# Patient Record
Sex: Female | Born: 1955 | Race: White | Hispanic: No | Marital: Married | Smoking: Never smoker
Health system: Southern US, Community
[De-identification: ages and names within clinical notes are randomized; demographics above are authoritative.]

## PROBLEM LIST (undated history)

## (undated) DIAGNOSIS — T8859XA Other complications of anesthesia, initial encounter: Secondary | ICD-10-CM

## (undated) DIAGNOSIS — G259 Extrapyramidal and movement disorder, unspecified: Secondary | ICD-10-CM

## (undated) DIAGNOSIS — N390 Urinary tract infection, site not specified: Secondary | ICD-10-CM

## (undated) DIAGNOSIS — G2581 Restless legs syndrome: Secondary | ICD-10-CM

## (undated) DIAGNOSIS — N8110 Cystocele, unspecified: Secondary | ICD-10-CM

## (undated) DIAGNOSIS — I10 Essential (primary) hypertension: Secondary | ICD-10-CM

## (undated) DIAGNOSIS — I890 Lymphedema, not elsewhere classified: Secondary | ICD-10-CM

## (undated) DIAGNOSIS — C50919 Malignant neoplasm of unspecified site of unspecified female breast: Secondary | ICD-10-CM

## (undated) DIAGNOSIS — N814 Uterovaginal prolapse, unspecified: Secondary | ICD-10-CM

## (undated) DIAGNOSIS — M170 Bilateral primary osteoarthritis of knee: Secondary | ICD-10-CM

## (undated) DIAGNOSIS — T4145XA Adverse effect of unspecified anesthetic, initial encounter: Secondary | ICD-10-CM

## (undated) DIAGNOSIS — J3489 Other specified disorders of nose and nasal sinuses: Secondary | ICD-10-CM

## (undated) DIAGNOSIS — G20A1 Parkinson's disease without dyskinesia, without mention of fluctuations: Secondary | ICD-10-CM

## (undated) DIAGNOSIS — I129 Hypertensive chronic kidney disease with stage 1 through stage 4 chronic kidney disease, or unspecified chronic kidney disease: Secondary | ICD-10-CM

## (undated) DIAGNOSIS — R42 Dizziness and giddiness: Secondary | ICD-10-CM

## (undated) DIAGNOSIS — G4730 Sleep apnea, unspecified: Secondary | ICD-10-CM

## (undated) DIAGNOSIS — I82409 Acute embolism and thrombosis of unspecified deep veins of unspecified lower extremity: Secondary | ICD-10-CM

## (undated) DIAGNOSIS — Z9011 Acquired absence of right breast and nipple: Secondary | ICD-10-CM

## (undated) DIAGNOSIS — H8309 Labyrinthitis, unspecified ear: Secondary | ICD-10-CM

## (undated) DIAGNOSIS — R519 Headache, unspecified: Secondary | ICD-10-CM

## (undated) DIAGNOSIS — K117 Disturbances of salivary secretion: Secondary | ICD-10-CM

## (undated) DIAGNOSIS — R300 Dysuria: Secondary | ICD-10-CM

## (undated) DIAGNOSIS — F419 Anxiety disorder, unspecified: Secondary | ICD-10-CM

## (undated) DIAGNOSIS — J189 Pneumonia, unspecified organism: Secondary | ICD-10-CM

## (undated) DIAGNOSIS — T7840XA Allergy, unspecified, initial encounter: Secondary | ICD-10-CM

## (undated) DIAGNOSIS — E785 Hyperlipidemia, unspecified: Secondary | ICD-10-CM

## (undated) DIAGNOSIS — K900 Celiac disease: Secondary | ICD-10-CM

## (undated) DIAGNOSIS — L989 Disorder of the skin and subcutaneous tissue, unspecified: Secondary | ICD-10-CM

## (undated) DIAGNOSIS — M5116 Intervertebral disc disorders with radiculopathy, lumbar region: Secondary | ICD-10-CM

## (undated) DIAGNOSIS — G43909 Migraine, unspecified, not intractable, without status migrainosus: Secondary | ICD-10-CM

## (undated) DIAGNOSIS — G629 Polyneuropathy, unspecified: Secondary | ICD-10-CM

## (undated) HISTORY — DX: Acquired absence of right breast and nipple: Z90.11

## (undated) HISTORY — DX: Lymphedema, not elsewhere classified: I89.0

## (undated) HISTORY — DX: Acute embolism and thrombosis of unspecified deep veins of unspecified lower extremity: I82.409

## (undated) HISTORY — DX: Parkinson's disease without dyskinesia, without mention of fluctuations: G20.A1

## (undated) HISTORY — DX: Celiac disease: K90.0

## (undated) HISTORY — DX: Urinary tract infection, site not specified: N39.0

## (undated) HISTORY — DX: Headache, unspecified: R51.9

## (undated) HISTORY — DX: Disturbances of salivary secretion: K11.7

## (undated) HISTORY — PX: COLONOSCOPY: SHX174

## (undated) HISTORY — DX: Hyperlipidemia, unspecified: E78.5

## (undated) HISTORY — DX: Bilateral primary osteoarthritis of knee: M17.0

## (undated) HISTORY — DX: Sleep apnea, unspecified: G47.30

## (undated) HISTORY — DX: Labyrinthitis, unspecified ear: H83.09

## (undated) HISTORY — DX: Cystocele, unspecified: N81.10

## (undated) HISTORY — DX: Disorder of the skin and subcutaneous tissue, unspecified: L98.9

## (undated) HISTORY — DX: Restless legs syndrome: G25.81

## (undated) HISTORY — PX: FUNCTIONAL ENDOSCOPIC SINUS SURGERY: SUR616

## (undated) HISTORY — DX: Uterovaginal prolapse, unspecified: N81.4

## (undated) HISTORY — DX: Allergy, unspecified, initial encounter: T78.40XA

## (undated) HISTORY — DX: Pneumonia, unspecified organism: J18.9

## (undated) HISTORY — DX: Dysuria: R30.0

## (undated) HISTORY — DX: Other specified disorders of nose and nasal sinuses: J34.89

## (undated) HISTORY — DX: Polyneuropathy, unspecified: G62.9

## (undated) HISTORY — DX: Hypertensive chronic kidney disease with stage 1 through stage 4 chronic kidney disease, or unspecified chronic kidney disease: I12.9

## (undated) HISTORY — DX: Extrapyramidal and movement disorder, unspecified: G25.9

## (undated) HISTORY — DX: Dizziness and giddiness: R42

## (undated) HISTORY — DX: Intervertebral disc disorders with radiculopathy, lumbar region: M51.16

## (undated) HISTORY — DX: Malignant neoplasm of unspecified site of unspecified female breast: C50.919

---

## 1998-06-10 DIAGNOSIS — I82409 Acute embolism and thrombosis of unspecified deep veins of unspecified lower extremity: Secondary | ICD-10-CM

## 1998-06-10 HISTORY — DX: Acute embolism and thrombosis of unspecified deep veins of unspecified lower extremity: I82.409

## 2002-06-10 HISTORY — PX: VAGINAL HYSTERECTOMY: SUR661

## 2011-05-07 HISTORY — PX: BREAST BIOPSY: SHX20

## 2011-05-11 DIAGNOSIS — C50919 Malignant neoplasm of unspecified site of unspecified female breast: Secondary | ICD-10-CM

## 2011-05-11 HISTORY — DX: Malignant neoplasm of unspecified site of unspecified female breast: C50.919

## 2011-05-23 ENCOUNTER — Telehealth: Payer: Self-pay | Admitting: *Deleted

## 2011-05-23 ENCOUNTER — Other Ambulatory Visit: Payer: Self-pay | Admitting: *Deleted

## 2011-05-23 DIAGNOSIS — C50419 Malignant neoplasm of upper-outer quadrant of unspecified female breast: Secondary | ICD-10-CM

## 2011-05-23 NOTE — Telephone Encounter (Signed)
Confirmed [facility] for 05/29/11 at 0815 .  Instructions and contact information given.  

## 2011-05-29 ENCOUNTER — Telehealth: Payer: Self-pay | Admitting: Oncology

## 2011-05-29 ENCOUNTER — Ambulatory Visit: Payer: Self-pay

## 2011-05-29 ENCOUNTER — Other Ambulatory Visit (INDEPENDENT_AMBULATORY_CARE_PROVIDER_SITE_OTHER): Payer: Self-pay | Admitting: General Surgery

## 2011-05-29 ENCOUNTER — Ambulatory Visit (INDEPENDENT_AMBULATORY_CARE_PROVIDER_SITE_OTHER): Payer: Self-pay | Admitting: General Surgery

## 2011-05-29 ENCOUNTER — Ambulatory Visit (HOSPITAL_BASED_OUTPATIENT_CLINIC_OR_DEPARTMENT_OTHER): Payer: BC Managed Care – PPO | Admitting: Oncology

## 2011-05-29 ENCOUNTER — Ambulatory Visit: Payer: BC Managed Care – PPO | Attending: General Surgery | Admitting: Physical Therapy

## 2011-05-29 ENCOUNTER — Ambulatory Visit (HOSPITAL_BASED_OUTPATIENT_CLINIC_OR_DEPARTMENT_OTHER): Payer: BC Managed Care – PPO | Admitting: General Surgery

## 2011-05-29 ENCOUNTER — Other Ambulatory Visit (HOSPITAL_BASED_OUTPATIENT_CLINIC_OR_DEPARTMENT_OTHER): Payer: BC Managed Care – PPO | Admitting: Lab

## 2011-05-29 ENCOUNTER — Other Ambulatory Visit: Payer: Self-pay | Admitting: Oncology

## 2011-05-29 ENCOUNTER — Encounter (INDEPENDENT_AMBULATORY_CARE_PROVIDER_SITE_OTHER): Payer: Self-pay | Admitting: General Surgery

## 2011-05-29 ENCOUNTER — Ambulatory Visit
Admission: RE | Admit: 2011-05-29 | Discharge: 2011-05-29 | Disposition: A | Discharge status: Home / Self Care | Payer: BC Managed Care – PPO | Source: Ambulatory Visit | Attending: Radiation Oncology | Admitting: Radiation Oncology

## 2011-05-29 VITALS — BP 163/82 | HR 76 | Temp 98.3°F | Ht 66.0 in | Wt 216.3 lb

## 2011-05-29 VITALS — BP 163/82 | HR 76 | Temp 98.3°F | Resp 20 | Ht 66.0 in | Wt 216.3 lb

## 2011-05-29 DIAGNOSIS — IMO0001 Reserved for inherently not codable concepts without codable children: Secondary | ICD-10-CM | POA: Insufficient documentation

## 2011-05-29 DIAGNOSIS — C50919 Malignant neoplasm of unspecified site of unspecified female breast: Secondary | ICD-10-CM | POA: Insufficient documentation

## 2011-05-29 DIAGNOSIS — C50419 Malignant neoplasm of upper-outer quadrant of unspecified female breast: Secondary | ICD-10-CM

## 2011-05-29 DIAGNOSIS — Z86718 Personal history of other venous thrombosis and embolism: Secondary | ICD-10-CM

## 2011-05-29 DIAGNOSIS — Z01818 Encounter for other preprocedural examination: Secondary | ICD-10-CM | POA: Insufficient documentation

## 2011-05-29 DIAGNOSIS — Z803 Family history of malignant neoplasm of breast: Secondary | ICD-10-CM

## 2011-05-29 LAB — CBC WITH DIFFERENTIAL/PLATELET
BASO%: 0.3 % (ref 0.0–2.0)
EOS%: 6.9 % (ref 0.0–7.0)
MCH: 31.3 pg (ref 25.1–34.0)
MCHC: 33.7 g/dL (ref 31.5–36.0)
NEUT%: 56.6 % (ref 38.4–76.8)
RBC: 4.34 10*6/uL (ref 3.70–5.45)
RDW: 12.6 % (ref 11.2–14.5)
lymph#: 2 10*3/uL (ref 0.9–3.3)

## 2011-05-29 LAB — CANCER ANTIGEN 27.29: CA 27.29: 44 U/mL — ABNORMAL HIGH (ref 0–39)

## 2011-05-29 LAB — COMPREHENSIVE METABOLIC PANEL
ALT: 17 U/L (ref 0–35)
AST: 17 U/L (ref 0–37)
Calcium: 9.8 mg/dL (ref 8.4–10.5)
Chloride: 102 mEq/L (ref 96–112)
Creatinine, Ser: 0.72 mg/dL (ref 0.50–1.10)
Potassium: 3.8 mEq/L (ref 3.5–5.3)
Sodium: 138 mEq/L (ref 135–145)

## 2011-05-29 NOTE — Telephone Encounter (Signed)
Gv pt appt for ct and bone scan on 06/07/2011 @ [facility], scheduled mri of the breast for 06/06/2011 @ 10am @ [facility].  Called Tammy lmovm to have [facility] to send mammogram films to [facility] before her MRI of the Breast appt.

## 2011-05-29 NOTE — Progress Notes (Signed)
[facility] Radiation Oncology NEW PATIENT EVALUATION  Name: Renee Serrano MRN: [id number]  Date: 05/29/2011  DOB: March 14, 1956  Status: outpatient   CC:No primary provider on file.  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 female who is   Seen today for evaluation of her T2 N0 invasive ductal carcinoma of the right breast. At the time of a screening mammogram in [place] on 04/09/2011 she was found to have a 3 cm mass within the right breast. This was performed after her gynecologist felt a mass. A needle core biopsy on 05/07/2011 was diagnostic for invasive ductal carcinoma which was triple negative. She did not have a MR scan she seen today at the [facility] along with Dr. Biagio Quint and Dr. Darnelle Catalan. She is without complaints.Marland Kitchen   PREVIOUS RADIATION THERAPY: No   PAST MEDICAL HISTORY:  has a past medical history of DVT (deep venous thrombosis); Sinus drainage; and Sleep apnea.     PAST SURGICAL HISTORY: Past Surgical History  Procedure Date  . Functional endoscopic sinus surgery   . Abdominal hysterectomy      FAMILY HISTORY: family history includes Cancer in her cousins, maternal aunts, maternal grandmother, and mother.   SOCIAL HISTORY:  reports that she has never smoked. She does not have any smokeless tobacco history on file. She reports that she drinks about .6 - 1.2 ounces of alcohol per week.   ALLERGIES: Penicillins   MEDICATIONS:  Current Outpatient Prescriptions  Medication Sig Dispense Refill  . Ascorbic Acid (VITAMIN C WITH ROSE HIPS) 500 MG tablet Take 500 mg by mouth daily.        Marland Kitchen aspirin 81 MG tablet Take 81 mg by mouth daily.        . cholecalciferol (VITAMIN D) 400 UNITS TABS Take 1,000 Units by mouth.        . citalopram (CELEXA) 40 MG tablet Take 40 mg by mouth daily.          . montelukast (SINGULAIR) 10 MG tablet Take 10 mg by mouth at bedtime.        . Multiple Vitamin (MULTIVITAMIN) tablet Take 1 tablet by mouth daily.        Marland Kitchen rOPINIRole (REQUIP) 0.5 MG tablet Take 1 mg by mouth at bedtime.        . simvastatin (ZOCOR) 40 MG tablet Take 40 mg by mouth at bedtime.            REVIEW OF SYSTEMS:  Pertinent items are noted in HPI.    PHYSICAL EXAM: Alert and oriented. Vital signs BP 163/82, pulse 76, temperature 98.3, RR 20 Head and neck examination grossly unremarkable. Nodes: Without palpable cervical, supraclavicular, or axillary lymphadenopathy. Chest: Lungs clear. Back: Without spinal or CVA tenderness. Heart: Regular rate and rhythm. Breasts: There is a 4 center mass palpable at approximately 10:00 within the upper-outer quadrant of the right breast. She is large breasted. Left breast without masses or lesions.    LABORATORY DATA:  Lab Results  Component Value Date   WBC 6.9 05/29/2011   HGB 13.6 05/29/2011   HCT 40.3 05/29/2011   MCV 92.9 05/29/2011   PLT 285 05/29/2011   Lab Results  Component Value Date   NA 138 05/29/2011   K 3.8 05/29/2011   CL 102 05/29/2011  CO2 28 05/29/2011   Lab Results  Component Value Date   ALT 17 05/29/2011   AST 17 05/29/2011   ALKPHOS 74 05/29/2011   BILITOT 0.2* 05/29/2011      IMPRESSION: Stage IIa (T2 N0 MX) invasive ductal carcinoma of the right breast. We discussed local regional management options which include mastectomy, or partial mastectomy followed by radiation therapy in addition to a sentinel lymph node biopsy. She'll initially be staged with a CT scan and bone scan. She is agreeable to neoadjuvant chemotherapy. Dr. Darnelle Catalan will make arrangements for her to see Dr. Mariel Sleet who can treat her). She may also receive her radiation therapy in [place]. A referral can be made the following her definitive surgery with Dr. Biagio Quint. I discussed the potential acute and late toxicities of radiation  therapy. She can expect more skin toxicity due to her breast size. It would be preferable to have her treated prone. She will be re presented at the Wednesday morning conference after completion of neoadjuvant chemotherapy, and then again after completion of definitive surgery.   PLAN: As above   I spent 40 minutes minutes face to face with the patient and more than 50% of that time was spent in counseling and/or coordination of care.

## 2011-05-29 NOTE — Progress Notes (Signed)
Renee Serrano  MR#: [id number]    History of present illness: The patient is a 55 year old Renee Serrano, [place] woman who had routine screening mammography in [facility] 03/26/2011.. There was a new approximately 3 cm mass in the upper outer quadrant of the right breast and she was recalled for diagnostic mammography October 30, with ultrasonography. Spot magnification views confirmed a 3 cm mass with spiculated irregular margins, measuring 2.4 cm by ultrasonography. It was solid. Ultrasound-guided biopsy was performed 05/07/2011 and this showed ([id number] at [facility]) and invasive ductal carcinoma, grade 3, triple negative, with a key 67 of 3+.  With this information the patient was presented at the multidisciplinary breast cancer conference 05/29/2011, and was seen at the clinic the same day.  Past medical history:      Past Medical History  Diagnosis Date  . DVT (deep venous thrombosis)   . Sinus drainage   . Sleep apnea     Hyperlipidemia, restless legs,   Past surgical history:      Past Surgical History  Procedure Date  . Functional endoscopic sinus surgery   . Abdominal hysterectomy     Family history:     Family History  Problem Relation Age of Onset  . Cancer Mother     colon  . Cancer Maternal Grandmother     breast   . Cancer Cousin     breast  . Cancer Cousin     breast  . Cancer Maternal Aunt     colon  . Cancer Maternal Aunt     breast    the patient's father died at the age of 25 from complications of lymphoma. The patient's mother died at the age of 42. She had been diagnosed with colon cancer approximately age 72. The patient had one brother and one sister. There is no breast or ovarian cancer in the immediate family. However one of the patient's mother's 5 sisters had ovarian cancer and breast cancer. Another one of her mother's 5 sisters had 2 daughters (the patient's cousins was (with breast cancer. The youngest of the 2 was diagnosed at age 81. There is  also a significant family history of colon cancer.  Gynecologic history: GX P3, first pregnancy to term age 25, menarche age 89. She underwent hysterectomy without salpingo-oophorectomy in 2004.    Social history:  She teaches second grade. Her husband [name], present today, work for the [employer]. Her son [name], 24, lives in [place] where he worked as a Chartered certified accountant. Son [name] 26 lips and Advil works in Airline pilot. Son [name] 20 at [employer]. The patient is to grandchildren and one on the way due July. She attends the local [employer]     ADVANCED DIRECTIVES: in place  Health maintenance:       History  Substance Use Topics  . Smoking status: Never Smoker   . Smokeless tobacco: Not on file  . Alcohol Use: 0.6 - 1.2 oz/week    1-2 Glasses of wine per week      Colonoscopy: 2009, under Dr Allena Katz in [place]  PAP: 2004  Bone density: > 10 years ago, "normal"  Cholesterol: under treatment  Review of systems:  She feels tired. She has been very anxious for nearly 2 months now since the first inkling of this problem, trying to get it resolved. She has pain in the right breast at times, describes this as throbbing and soreness. She bruises easily. Otherwise a detailed review of systems was unremarkable.--- I should  add that there was no location she is aware of for her prior lower extremity DVT. She does not know whether a coagulopathy panel was sent at that time.  Allergies:     Allergies  Allergen Reactions  . Penicillins Hives    Medications:      Current Outpatient Prescriptions  Medication Sig Dispense Refill  . Ascorbic Acid (VITAMIN C WITH ROSE HIPS) 500 MG tablet Take 500 mg by mouth daily.        Marland Kitchen aspirin 81 MG tablet Take 81 mg by mouth daily.        . cholecalciferol (VITAMIN D) 400 UNITS TABS Take 1,000 Units by mouth.        . citalopram (CELEXA) 40 MG tablet Take 40 mg by mouth daily.        . montelukast (SINGULAIR) 10 MG tablet Take 10 mg by mouth  at bedtime.        . Multiple Vitamin (MULTIVITAMIN) tablet Take 1 tablet by mouth daily.        Marland Kitchen rOPINIRole (REQUIP) 0.5 MG tablet Take 1 mg by mouth at bedtime.        . simvastatin (ZOCOR) 40 MG tablet Take 40 mg by mouth at bedtime.          Physical exam:      Filed Vitals:   05/29/11 0843  BP: 163/82  Pulse: 76  Temp: 98.3 F (36.8 C)     Body mass index is 34.91 kg/(m^2).   ECOG performance status: 0  Oropharynx: Clear  Lungs: No crackles or wheezes  Heart regular rate and rhythm, no murmur appreciated  Right breast: Status post recent biopsy; there is a moderate ecchymosis; I do not palpated well defined mass;  Left breast: No suspicious masses  Abdomen: Soft nontender positive bowel sounds  Musculoskeletal exam no focal spinal tenderness, no peripheral edema  Neurological exam: Nonfocal.  Adenopathy: The right axilla in particular is negative to palpation  Lab results:            Chemistry      Component Value Date/Time   NA 138 05/29/2011 0822   K 3.8 05/29/2011 0822   CL 102 05/29/2011 0822   CO2 28 05/29/2011 0822   BUN 18 05/29/2011 0822   CREATININE 0.72 05/29/2011 0822      Component Value Date/Time   CALCIUM 9.8 05/29/2011 0822   ALKPHOS 74 05/29/2011 0822   AST 17 05/29/2011 0822   ALT 17 05/29/2011 0822   BILITOT 0.2* 05/29/2011 0822         Lab Results  Component Value Date   WBC 6.9 05/29/2011   HGB 13.6 05/29/2011   HCT 40.3 05/29/2011   MCV 92.9 05/29/2011   PLT 285 05/29/2011   NEUTROABS 3.9 05/29/2011    Studies:      As already discussed.   Assessment: 55 year old Renee Serrano, [place] woman status post right breast biopsy November 2012 for a clinical T2 N0 (stage II) invasive ductal carcinoma, triple negative, with an elevated MIB-1; with a provocative family cancer history, and a history of unprovoked lower extremity DVT in 2000.       Plan: We spent the better part of her 1 hour visit today at discussing the biology  of her tumor, the significance of a triple negative tumor, and her choices. She understands that in terms of survival mastectomy versus lumpectomy plus radiation are equivalent. She also understands that neoadjuvant versus adjuvant chemotherapy produces the same survival results.  As far as her breast cancer is concerned a recommendation was that she start with neoadjuvant chemotherapy, specifically cyclophosphamide/doxorubicin in dose dense fashion x4 followed by paclitaxel in dose dense fashion x4, to be followed by definitive surgery. She will benefit from staging with a chest CT and bone scan and she will also have an echocardiogram. We're scheduling her to meet with our genetic counselor and also with "chemotherapy school" here.  She would much prefer to be treated in [place], and we will try to get her an appointment in early January with Dr. Glenford Peers. She will have a port placed by Dr. Biagio Quint prior to that visit. The patient probably should have a hypercoagulable panel drawn at some point at Dr. Crista Curb discretion.   Baron Parmelee C 05/29/2011

## 2011-05-29 NOTE — Progress Notes (Signed)
Renee Serrano is an 55 y.o. female.   Chief Complaint: right breast cancer HPI: this patient is seen in the multidisciplinary breast clinic today for evaluation of a newly diagnosed right breast cancer found on screening mammogram. She states that she has not been doing her self breast exam and has not felt any abnormalities in her breast but she has had some right breast discomfort in the area. She also states that her gynecologist felt this mass recently and she was referred for imaging studies. Since her mammogram she has had breast tenderness in the area and she can now feel the mass in question. She denies any prior mammographic abnormalities she does have a family history of breast cancer in a grandmother and several cousins and an aunt.she denies any headaches, bony pains, or weight loss or systemic symptoms. On her mammogram she has a well-circumscribed 2.4 cm mass in her right breast and she has not had any followup MRI or additional imaging. This was consistent with invasive ductal carcinoma which was ER negative PR negative and HER-2 negative. Of note, she did have a DVT in 2000 for which she required 6 months of Coumadin. She states that she underwent coagulation testing at that time and she tells me that there was no abnormalities identified.  Past Medical History  Diagnosis Date  . DVT (deep venous thrombosis)   . Sinus drainage   . Sleep apnea     Past Surgical History  Procedure Date  . Functional endoscopic sinus surgery   . Abdominal hysterectomy     Family History  Problem Relation Age of Onset  . Cancer Mother     colon  . Cancer Maternal Grandmother     breast   . Cancer Cousin     breast  . Cancer Cousin     breast  . Cancer Maternal Aunt     colon  . Cancer Maternal Aunt     breast    Social History:  reports that she has never smoked. She does not have any smokeless tobacco history on file. She reports that she drinks about .6 - 1.2 ounces of alcohol per week.  Her drug history not on file.  Allergies:  Allergies  Allergen Reactions  . Penicillins Hives  All other review of systems negative or noncontributory except as stated in the HPI   No current outpatient prescriptions on file as of 05/29/2011.   No current facility-administered medications on file as of 05/29/2011.    Results for orders placed in visit on 05/29/11 (from the past 48 hour(s))  CBC WITH DIFFERENTIAL     Status: Normal   Collection Time   05/29/11  8:22 AM      Component Value Range Comment   WBC 6.9  3.9 - 10.3 (10e3/uL)    NEUT# 3.9  1.5 - 6.5 (10e3/uL)    HGB 13.6  11.6 - 15.9 (g/dL)    HCT 16.1  09.6 - 04.5 (%)    Platelets 285  145 - 400 (10e3/uL)    MCV 92.9  79.5 - 101.0 (fL)    MCH 31.3  25.1 - 34.0 (pg)    MCHC 33.7  31.5 - 36.0 (g/dL)    RBC 4.09  8.11 - 9.14 (10e6/uL)    RDW 12.6  11.2 - 14.5 (%)    lymph# 2.0  0.9 - 3.3 (10e3/uL)    MONO# 0.5  0.1 - 0.9 (10e3/uL)    Eosinophils Absolute 0.5  0.0 - 0.5 (10e3/uL)  Basophils Absolute 0.0  0.0 - 0.1 (10e3/uL)    NEUT% 56.6  38.4 - 76.8 (%)    LYMPH% 29.5  14.0 - 49.7 (%)    MONO% 6.7  0.0 - 14.0 (%)    EOS% 6.9  0.0 - 7.0 (%)    BASO% 0.3  0.0 - 2.0 (%)   COMPREHENSIVE METABOLIC PANEL     Status: Abnormal   Collection Time   05/29/11  8:22 AM      Component Value Range Comment   Sodium 138  135 - 145 (mEq/L)    Potassium 3.8  3.5 - 5.3 (mEq/L)    Chloride 102  96 - 112 (mEq/L)    CO2 28  19 - 32 (mEq/L)    Glucose, Bld 73  70 - 99 (mg/dL)    BUN 18  6 - 23 (mg/dL)    Creatinine, Ser 4.54  0.50 - 1.10 (mg/dL)    Total Bilirubin 0.2 (*) 0.3 - 1.2 (mg/dL)    Alkaline Phosphatase 74  39 - 117 (U/L)    AST 17  0 - 37 (U/L)    ALT 17  0 - 35 (U/L)    Total Protein 7.6  6.0 - 8.3 (g/dL)    Albumin 3.8  3.5 - 5.2 (g/dL)    Calcium 9.8  8.4 - 10.5 (mg/dL)    No results found.  @ROS @  Blood pressure 163/82, pulse 76, temperature 98.3 F (36.8 C), resp. rate 20, height 5\' 6"  (1.676 m), weight  216 lb 4.8 oz (98.113 kg). General appearance: alert, cooperative and no distress Head: Normocephalic, without obvious abnormality, atraumatic Eyes: conjunctivae/corneas clear. PERRL, EOM's intact. Fundi benign. Neck: no adenopathy, no carotid bruit and supple, symmetrical, trachea midline Resp: clear to auscultation bilaterally Chest wall: no tenderness Breasts: normal appearance, no masses or tenderness, on the right breast she has a large palpable mass in the right upper outer quadrant with biopsy changes. No other dominant masses or skin changes or lymphadenopathy. On the left breast she has no abnormal skin changes, masses, or lymphadenopathy Cardio: regular rate and rhythm, S1, S2 normal, no murmur, click, rub or gallop GI: soft, non-tender; bowel sounds normal; no masses,  no organomegaly Extremities: extremities normal, atraumatic, no cyanosis or edema Pulses: 2+ and symmetric Skin: Skin color, texture, turgor normal. No rashes or lesions Lymph nodes: Cervical, supraclavicular, and axillary nodes normal.  Assessment/Plan Right breast cancer She has a 2.4 cm mass in the right upper outer quadrant of her breast consistent with invasive breast cancer.she has no clinical adenopathy. The palpated mass appears larger than on her imaging although this is likely due to postbiopsy changes. I discussed with her the options of neoadjuvant treatment versus breast conservation versus mastectomy. We discussed neoadjuvant therapy versus lumpectomy with radiation and sentinel lymph node biopsy versus mastectomy and sentinel lymph node biopsy. We discussed the pros and cons of each option. Regardless she will need Mediport placement and we'll plan to put this on the contralateral side which is the left and this case. After my discussion with her, she was leaning towards mastectomy with sentinel lymph node biopsy and MediPort placement although after discussing this with her oncologist and radiation  oncologist I think that she has decided on neoadjuvant therapy and planning to do breast conservation treatment. I think this is the final option and we will schedule her for Mediport placement as soon as available.I spent approximately 1 hour with the patient and her family discussing her alternatives  and counseling the patient.  Lodema Pilot DAVID 05/29/2011, 12:14 PM

## 2011-05-30 ENCOUNTER — Encounter: Payer: Self-pay | Admitting: *Deleted

## 2011-05-30 ENCOUNTER — Telehealth: Payer: Self-pay | Admitting: Oncology

## 2011-05-30 NOTE — Progress Notes (Signed)
Clinical Social Work met with pt and pt's husband in [facility].  CSW provided pt with information on Patient and Family support services and resources.  CSW also provided pt with contact information and a patient and family support calender.  Pt expressed feelings of increased anxiety and stress since her diagnosis.  Pt stated she had 3 family members pass away in 2012, which has been very difficult.  CSW validated pt's feelings and concerns.  Pt seems to have a positive support system in her husband, but does recognize the need for continued counseling and the processing of her feelings.  Pt currently lives in [place] and will be receiving tx at [facility].  CSW offered pt supportive services such as counseling and support groups.  Pt stated that although she lives in [place] she would be willing to make appointments to address her emotional needs.  CSW applauded the pt and encouraged her to contact CSW to scheduled a meeting time or with any needs or concerns.    Tamala Julian, LCSW

## 2011-05-30 NOTE — Telephone Encounter (Signed)
Put fmla paper on nurse's desk. °

## 2011-05-31 ENCOUNTER — Telehealth: Payer: Self-pay | Admitting: Oncology

## 2011-05-31 ENCOUNTER — Ambulatory Visit: Payer: BC Managed Care – PPO

## 2011-05-31 NOTE — Telephone Encounter (Signed)
I called to follow up after Multidisciplinary Clinic- and the patient had several questions.   She is scheduled for her PORT placement on 1/7 and scheduled to see Dr. Mariel Sleet 1/3.  I explained the Dr. Mariel Sleet needs to see her to review her plan of care before she starts chemotherapy.    Pt verbalized understanding.

## 2011-05-31 NOTE — Progress Notes (Signed)
Pt. Seen for genetic counseling.  Blood drawn for BRCA 1/2.  Pt. Is retrieving more info.  Probably will consider Lynch syndrome testing.

## 2011-05-31 NOTE — Telephone Encounter (Signed)
Faxed fmla papers to [employer] @ [phone].

## 2011-06-03 ENCOUNTER — Telehealth: Payer: Self-pay | Admitting: *Deleted

## 2011-06-03 ENCOUNTER — Other Ambulatory Visit: Payer: Self-pay | Admitting: *Deleted

## 2011-06-03 NOTE — Telephone Encounter (Signed)
left voice message to inform the patient of the new date and time of the appointment for echo

## 2011-06-05 ENCOUNTER — Other Ambulatory Visit: Payer: BC Managed Care – PPO

## 2011-06-05 ENCOUNTER — Encounter: Payer: Self-pay | Admitting: *Deleted

## 2011-06-06 ENCOUNTER — Ambulatory Visit (HOSPITAL_COMMUNITY)
Admission: RE | Admit: 2011-06-06 | Discharge: 2011-06-06 | Disposition: A | Discharge status: Home / Self Care | Payer: BC Managed Care – PPO | Source: Ambulatory Visit | Attending: Oncology | Admitting: Oncology

## 2011-06-06 ENCOUNTER — Telehealth: Payer: Self-pay | Admitting: *Deleted

## 2011-06-06 DIAGNOSIS — I517 Cardiomegaly: Secondary | ICD-10-CM

## 2011-06-06 DIAGNOSIS — C50919 Malignant neoplasm of unspecified site of unspecified female breast: Secondary | ICD-10-CM

## 2011-06-06 DIAGNOSIS — C773 Secondary and unspecified malignant neoplasm of axilla and upper limb lymph nodes: Secondary | ICD-10-CM | POA: Insufficient documentation

## 2011-06-06 DIAGNOSIS — I10 Essential (primary) hypertension: Secondary | ICD-10-CM | POA: Insufficient documentation

## 2011-06-06 DIAGNOSIS — C801 Malignant (primary) neoplasm, unspecified: Secondary | ICD-10-CM

## 2011-06-06 DIAGNOSIS — E785 Hyperlipidemia, unspecified: Secondary | ICD-10-CM | POA: Insufficient documentation

## 2011-06-06 MED ORDER — GADOBENATE DIMEGLUMINE 529 MG/ML IV SOLN
20.0000 mL | Freq: Once | INTRAVENOUS | Status: AC | PRN
  Administered 2011-06-06: 20 mL via INTRAVENOUS

## 2011-06-06 NOTE — Progress Notes (Signed)
  Echocardiogram 2D Echocardiogram has been performed.  Renee Serrano [name] 06/06/2011, 9:56 AM

## 2011-06-06 NOTE — Telephone Encounter (Signed)
Left message with sister to call back concerning [facility] from 05/29/11.

## 2011-06-07 ENCOUNTER — Encounter (HOSPITAL_COMMUNITY)
Admission: RE | Admit: 2011-06-07 | Discharge: 2011-06-07 | Disposition: A | Discharge status: Home / Self Care | Payer: BC Managed Care – PPO | Source: Ambulatory Visit | Attending: Oncology | Admitting: Oncology

## 2011-06-07 ENCOUNTER — Encounter (HOSPITAL_COMMUNITY): Payer: Self-pay

## 2011-06-07 ENCOUNTER — Other Ambulatory Visit: Payer: Self-pay | Admitting: Oncology

## 2011-06-07 DIAGNOSIS — R928 Other abnormal and inconclusive findings on diagnostic imaging of breast: Secondary | ICD-10-CM

## 2011-06-07 DIAGNOSIS — C50419 Malignant neoplasm of upper-outer quadrant of unspecified female breast: Secondary | ICD-10-CM

## 2011-06-07 DIAGNOSIS — R918 Other nonspecific abnormal finding of lung field: Secondary | ICD-10-CM | POA: Insufficient documentation

## 2011-06-07 DIAGNOSIS — C50919 Malignant neoplasm of unspecified site of unspecified female breast: Secondary | ICD-10-CM | POA: Insufficient documentation

## 2011-06-07 DIAGNOSIS — N63 Unspecified lump in unspecified breast: Secondary | ICD-10-CM | POA: Insufficient documentation

## 2011-06-07 MED ORDER — TECHNETIUM TC 99M MEDRONATE IV KIT: 25.1000 | PACK | Freq: Once | INTRAVENOUS | Status: DC | PRN

## 2011-06-07 MED ORDER — IOHEXOL 300 MG/ML  SOLN
80.0000 mL | Freq: Once | INTRAMUSCULAR | Status: AC | PRN
  Administered 2011-06-07: 80 mL via INTRAVENOUS

## 2011-06-10 ENCOUNTER — Telehealth (INDEPENDENT_AMBULATORY_CARE_PROVIDER_SITE_OTHER): Payer: Self-pay

## 2011-06-10 ENCOUNTER — Encounter: Payer: Self-pay | Admitting: *Deleted

## 2011-06-10 ENCOUNTER — Encounter (HOSPITAL_BASED_OUTPATIENT_CLINIC_OR_DEPARTMENT_OTHER): Payer: Self-pay | Admitting: *Deleted

## 2011-06-10 NOTE — Progress Notes (Signed)
Mailed after appt letter to pt. 

## 2011-06-10 NOTE — Telephone Encounter (Signed)
Patient called with concerns about not having a marker place in her right breast. She is to have PAC placed  06/16/2010.  Consuella Lose at [facility] told her she should inquire about placement of wire.

## 2011-06-10 NOTE — Progress Notes (Signed)
Pt had chest ct 12/12 To come in for labs and ekg Hx dvt-unknown origin Called dr Hinda Kehr did not put clip in during bx-needs on prior to chemo

## 2011-06-12 ENCOUNTER — Encounter (HOSPITAL_COMMUNITY): Payer: Self-pay

## 2011-06-12 ENCOUNTER — Ambulatory Visit (HOSPITAL_COMMUNITY)
Admission: RE | Admit: 2011-06-12 | Discharge: 2011-06-12 | Disposition: A | Discharge status: Home / Self Care | Payer: BC Managed Care – PPO | Source: Ambulatory Visit | Attending: Oncology | Admitting: Oncology

## 2011-06-12 ENCOUNTER — Encounter (HOSPITAL_COMMUNITY): Payer: BC Managed Care – PPO | Attending: Oncology | Admitting: Oncology

## 2011-06-12 ENCOUNTER — Encounter (HOSPITAL_COMMUNITY): Payer: Self-pay | Admitting: Oncology

## 2011-06-12 VITALS — BP 143/83 | HR 87 | Temp 98.1°F | Ht 65.5 in | Wt 219.8 lb

## 2011-06-12 DIAGNOSIS — E669 Obesity, unspecified: Secondary | ICD-10-CM

## 2011-06-12 DIAGNOSIS — Z809 Family history of malignant neoplasm, unspecified: Secondary | ICD-10-CM

## 2011-06-12 DIAGNOSIS — C50419 Malignant neoplasm of upper-outer quadrant of unspecified female breast: Secondary | ICD-10-CM

## 2011-06-12 DIAGNOSIS — C50919 Malignant neoplasm of unspecified site of unspecified female breast: Secondary | ICD-10-CM | POA: Insufficient documentation

## 2011-06-12 MED ORDER — LIDOCAINE-PRILOCAINE 2.5-2.5 % EX CREA
TOPICAL_CREAM | Freq: Once | CUTANEOUS | Status: DC
  Filled 2011-06-12: qty 5

## 2011-06-12 MED ORDER — IOHEXOL 300 MG/ML  SOLN
100.0000 mL | Freq: Once | INTRAMUSCULAR | Status: AC | PRN
  Administered 2011-06-12: 100 mL via INTRAVENOUS

## 2011-06-12 NOTE — Progress Notes (Signed)
This office note has been dictated.

## 2011-06-12 NOTE — Progress Notes (Signed)
CC:   Renee Pilot, MD Maryln Gottron, M.D.  DIAGNOSES: 1. 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  She has CT appearing abnormal     nodes in the axilla and along the chest wall, there is either a     lymph node or extension of tumor to pectoralis major and this is     also confirmed by MRI of the breasts. 2. History of asthma as a child with a recurrence after her 3rd     pregnancy. 3. Sleep apnea, on a CPAP machine. 4. Mild obesity. 5. History of a hysterectomy in 2004. 6. History of deep venous thrombosis with a negative workup in 2000 on     Coumadin for 6 months at that time. 7. History of hyperlipidemia. 8. History of restless leg syndrome. 9. History of breast cancer in her family, namely her mother's mother     had breast cancer.  Her mother had colon cancer.  Her mother's     sister had colon cancer with recurrence x2.  Another sister died of     ovarian cancer in her 75s.  There is a maternal aunt's 2 children,     one of whom is ductal carcinoma in situ and the other has had     triple negative breast cancer in her late 75s, seen at [facility].  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  She has already been seen in at the multidisciplinary breast clinic in [facility] by Dr. Biagio Quint, Dr. Darnelle Catalan, and Dr. Dayton Scrape. At that time, she was felt to have a 2.5 cm tumor based upon mammography.  By MRI, it was felt to be 4 cm, but MR and CT supported the diagnosis of abnormal lymphadenopathy and extension of either tumor to the chest wall at the area of the pectorals major or lymph node sitting on the pectorals major.  There was no obvious inflammatory component.  Since she 1st found this in late November 2012, confirmed by an abnormal mammogram and then a biopsy, she states that this tumor has grown  in size by her evaluation.  FAMILY HISTORY:  Her mother, again, died at age 80 but was diagnosed with colon cancer around age 45.  Her father died of complications of non-Hodgkin's lymphoma at age 52.  She has a brother who had died of a stroke as a complication of alcoholism.  She states she still has a sister alive and in good health.  SOCIAL HISTORY:  She is not a smoker.  She drinks alcohol only rarely. 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  She lives in [place].  She was born in [place] and moved to [place] after she married her husband, who is from [place].  She states her husband is in good health.  She did have some blood work prior to the visit here today, which did show an elevation in CA 27.29 to 44.  Her liver enzymes, though, were normal.  CBC and differential were unremarkable.  She had a 2D echo which showed normal heart function.  She has had a bone scan which is negative except for degenerative changes in her spine and feet.  She had a CT of the chest with contrast.  She had 3 tiny pulmonary nodules  that are very, very nonspecific and not truly suggestive of mets.  The breast mass was easily seen as were the lymph nodes, and the extension to the underlying pectoralis major muscle.  PHYSICAL EXAMINATION TODAY:  General Appearance:  She is a very pleasant, educated woman in no acute distress.  She is anxious but that is to be expected.  Vital Signs:  Her blood pressure is 143/83.  She is 5 feet and 5-1/2 inches tall.  Weight is 219 pounds.  She states that she has probably gained 50 pounds over the last 2 years because of the stress of her parents both dying in 2012 and she was taking care them for the prior 2 years before their deaths.  Her pulse is 80 and regular. Respirations are 16-18 and unlabored in the sitting position.  She is afebrile.  She has not complained of any pain.  She is alert.  She is oriented.  HEENT:  She has what  appear to be a couple spacious cysts in her scalp.  Lymph Nodes:  She has no adenopathy in the cervical, supraclavicular, infraclavicular, or axillary or inguinal areas that I can appreciate.  Abdomen:  Soft and nontender without organomegaly. Heart:  Regular rhythm and rate without murmur, rub, or gallop.  Lungs: Clear at this time.  Left Breast Exam:  Negative.  Right Breast:  I think it is slightly larger and there is an easily palpable masses in the upper outer quadrant extending towards the nipple-areolar complex, which is easily 5 x 7 cm and possibly 5 x 8 cm.  There are no clinically palpable axillary nodes.  There is no inflammatory change over the breast.  Extremities:  She has no arm edema.  No leg edema.  Neurologic: She is alert and oriented.  PLAN:  After reviewing the films with them, which they wanted to see, I would like to add a CT of the abdomen and pelvis with contrast for her completion of staging.  I would like to repeat her blood work on the day of therapy.  She is to have a lymph node biopsy tomorrow to complete her staging evaluation.  The CT of the abdomen will be done today.  Her port will be placed Monday and we will treat her Tuesday with dose-dense AC followed by dose dense Taxol 175mg /m2 every 14 days.  Both regimens will be for 4 cycles.  We will make sure she has an appointment with Dr. Biagio Quint once we are into the Taxol regimen and followup with Dr. Dayton Scrape will be arranged as well.  I will see her 10 days or so after the 1st cycle.  We went over a host of questions that, hopefully, we have answered to their satisfaction.  Hopefully, she will respond well.  She, of course, has a triple negative tumor that is high grade and, at least, appears to be clinically involving nodes and I think it is a clinically stage III lesion at this time.    ______________________________ Ladona Horns. Mariel Sleet, MD ESN/MEDQ  D:  06/12/2011  T:  06/12/2011  Job:  [id number]

## 2011-06-13 ENCOUNTER — Other Ambulatory Visit: Payer: Self-pay | Admitting: Oncology

## 2011-06-13 ENCOUNTER — Ambulatory Visit
Admission: RE | Admit: 2011-06-13 | Discharge: 2011-06-13 | Disposition: A | Discharge status: Home / Self Care | Payer: BC Managed Care – PPO | Source: Ambulatory Visit | Attending: Oncology | Admitting: Oncology

## 2011-06-13 ENCOUNTER — Telehealth (HOSPITAL_COMMUNITY): Payer: Self-pay | Admitting: *Deleted

## 2011-06-13 ENCOUNTER — Encounter (HOSPITAL_BASED_OUTPATIENT_CLINIC_OR_DEPARTMENT_OTHER)
Admission: RE | Admit: 2011-06-13 | Discharge: 2011-06-13 | Disposition: A | Discharge status: Home / Self Care | Payer: BC Managed Care – PPO | Source: Ambulatory Visit | Attending: General Surgery | Admitting: General Surgery

## 2011-06-13 DIAGNOSIS — R928 Other abnormal and inconclusive findings on diagnostic imaging of breast: Secondary | ICD-10-CM

## 2011-06-13 LAB — CBC
HCT: 40.7 % (ref 36.0–46.0)
Platelets: 299 10*3/uL (ref 150–400)
RBC: 4.4 MIL/uL (ref 3.87–5.11)
RDW: 12.9 % (ref 11.5–15.5)
WBC: 7.1 10*3/uL (ref 4.0–10.5)

## 2011-06-13 LAB — DIFFERENTIAL
Basophils Absolute: 0 10*3/uL (ref 0.0–0.1)
Basophils Relative: 1 % (ref 0–1)
Eosinophils Absolute: 0.5 10*3/uL (ref 0.0–0.7)
Eosinophils Relative: 7 % — ABNORMAL HIGH (ref 0–5)
Monocytes Absolute: 0.7 10*3/uL (ref 0.1–1.0)
Monocytes Relative: 10 % (ref 3–12)

## 2011-06-13 LAB — COMPREHENSIVE METABOLIC PANEL
AST: 20 U/L (ref 0–37)
Albumin: 3.8 g/dL (ref 3.5–5.2)
Alkaline Phosphatase: 70 U/L (ref 39–117)
CO2: 28 mEq/L (ref 19–32)
Chloride: 103 mEq/L (ref 96–112)
GFR calc non Af Amer: >90 mL/min (ref >90)
Potassium: 4.4 mEq/L (ref 3.5–5.1)
Total Bilirubin: 0.3 mg/dL (ref 0.3–1.2)

## 2011-06-13 LAB — APTT: aPTT: 31 seconds (ref 24–37)

## 2011-06-13 MED ORDER — ONDANSETRON HCL 8 MG PO TABS: ORAL_TABLET | ORAL | Status: DC

## 2011-06-13 MED ORDER — PROCHLORPERAZINE 25 MG RE SUPP: 25.0000 mg | Freq: Four times a day (QID) | RECTAL | Status: DC | PRN

## 2011-06-13 MED ORDER — LORAZEPAM 1 MG PO TABS: 1.0000 mg | ORAL_TABLET | ORAL | Status: DC | PRN

## 2011-06-13 MED ORDER — DEXAMETHASONE 4 MG PO TABS: ORAL_TABLET | ORAL | Status: DC

## 2011-06-13 NOTE — Telephone Encounter (Signed)
Message left on patient's voicemail to let her know that her CT of the abdomen was negative. Could not get an answer on husband's cell so no message left.

## 2011-06-13 NOTE — Progress Notes (Signed)
Addended by: Oda Kilts on: 06/13/2011 04:13 PM   Modules accepted: Orders

## 2011-06-14 ENCOUNTER — Telehealth (HOSPITAL_COMMUNITY): Payer: Self-pay | Admitting: Oncology

## 2011-06-14 ENCOUNTER — Other Ambulatory Visit (HOSPITAL_COMMUNITY): Payer: Self-pay | Admitting: Oncology

## 2011-06-14 ENCOUNTER — Encounter (HOSPITAL_BASED_OUTPATIENT_CLINIC_OR_DEPARTMENT_OTHER): Payer: Self-pay | Admitting: *Deleted

## 2011-06-14 NOTE — Patient Instructions (Addendum)
[facility]   CHEMOTHERAPY INSTRUCTIONS Adriamycin & Cytoxan for 4 cycles, then Taxol x 4 cycles  POTENTIAL SIDE EFFECTS OF TREATMENT: Increased Susceptibility to Infection, Vomiting, Constipation, Red or Pink Urine (with Adriamycin), Hair Thinning, Changes in Character of Skin and Nails (brittleness, dryness,etc.), Pigment Changes (darkening of veins, nail beds, palms of hands, soles of feet, etc.), Bone Marrow Suppression, Abdominal Cramping, Urinary Frequency and Blood in Urine  Adriamycin - lowers white blood cells - which fight infection, lowers red blood cells - which make up your blood, lowers platelets - which make your blood clot. It can cause hair loss, mouth sores, cardiotoxicity (this is why you will do 2D Echoes periodically while on [facility]), sensitivity to light, fatigue, and this will turn your urine RED for approximately 24-48 hours. By 48 hours your urine should have cleared back to its normal color of yellow. Please let us know if it hasn't.   Cytoxan - hemorrhagic cystitis (bloody urine) - this chemo irritates your bladder!!!! It can also cause nausea/vomiting and hair loss. You need to PUSH fluids (preferably water 64oz per day)! Do not hold your urine! Urinate before you go to bed and if you wake up in the middle of the night!!!  Taxol - lowers your WBCs, hair loss, muscle aches, nausea/vomiting, irritation to the mouth (mouth sores, pain in your mouth), *neuropathy - numbness/tingling/burning in the hands/finger/feet/toes. We need to know when this develops so that we can monitor it. You may develop decreased sensation in those areas also. We need to know when this develops and the severity of it.   Neulasta - this isn't chemo - but is a medication that you will receive via injection into your abdominal fat 20-24 hours after chemo.  This medication stimulates your bone marrow to produce white blood cells (WBCs). We give you this because chemo wipes  out your WBCs. WBCs fight infection and since chemo destroys your WBCs we give you Neulasta. The major side effect from this med can be bone pain especially in the large bones. If this happens - let us know. The drug of choice to help alleviate the pain is Aleve or Advil. Take as directed on bottle.  SELF IMAGE NEEDS AND REFERRALS MADE: Obtain hair accessories as soon as possible (wigs, scarves, turbans,caps,etc.) and Referral to Look Good, Feel Better consultant   EDUCATIONAL MATERIALS GIVEN AND REVIEWED: Chemotherapy and You and Specific Instructions Sheets (Care Notes on AC, Taxol, Neulasta, premeds and postmeds)   SELF CARE ACTIVITIES WHILE ON CHEMOTHERAPY: Increase your fluid intake 48 hours prior to treatment and drink at least 2 quarts (64 oz) per day after treatment., No alcohol intake., No aspirin or other medications unless approved by your oncologist., Eat foods that are light and easy to digest., Eat foods at cold or room temperature., No fried, fatty, or spicy foods immediately before or after treatment., Have teeth cleaned professionally before starting treatment. Keep dentures and partial plates clean., Use soft toothbrush and do not use mouthwashes that contain alcohol. Biotene is a good mouthwash that is available at most pharmacies or may be ordered by calling [phone]., Use warm salt water gargles (1 teaspoon salt per 1 quart warm water) before and after meals and at bedtime. Or you may rinse with 2 tablespoons of three -percent hydrogen peroxide mixed in eight ounces of water., Always use sunscreen with SPF (Sun Protection Factor) of 30 or higher., Use your nausea medication as directed to prevent nausea.  and Use your stool softener or laxative as directed to prevent constipation.   MEDICATIONS: You have been given prescriptions for the following medications:  Decadron/Dexamethasone 4mg  tablet. While taking your [facility] treatments, starting the day after chemo, take 2 tablets in  the am, then for the next 2 days take 2 tabs in the am and 2 tabs in the pm.   Decadron/Dexamethasone 4mg  tablet. While taking your Taxol treatments, take Dexamethasone 2 tablets @ 9pm the night before chemo and take 2 tabs at 3am the morning of chemo. Then starting the day after chemo take 2 tablets in the am and 2 tablets in the pm x 2 days.  Zofran/Ondansetron 8mg  tablet - While taking AC, starting the 3rd day after chemo, take 1 tablet twice a day as needed for nausea or vomiting.   Zofran/Ondansetron 8mg  tablet - While taking Taxol, starting on the day after chemo, take 1 tab in the am and 1 tab in the pm for 2 days. Then may take 1 tab twice a day as needed for nausea/vomiting.  Ativan/Lorazepam 1mg  every 4 hours as needed for nausea or vomiting. This will make you sleepy. Do not drive.   Compazine suppositories 25mg  1 per rectum every 6 hours as needed for nausea or vomiting.  Colace - this is a stool softener. Take 100mg  capsule 2-6 times a day as needed. If you have to take more than 6 capsules of Colace a day call the Cancer Center.  Senna - this is a mild laxative used to treat mild constipation. May take 2 tabs by mouth daily or up to twice a day as needed for mild constipation.  Milk of Magnesia - this is a laxative used to treat moderate to severe constipation. May take 2-4 tablespoons  every 8 hours as needed. May increase to 8 tablespoons x 1 dose and if no bowel movement call the Cancer Center   SYMPTOMS TO REPORT AS SOON AS POSSIBLE AFTER TREATMENT:  FEVER GREATER THAN 100.5 F  CHILLS WITH OR WITHOUT FEVER  NAUSEA AND VOMITING THAT IS NOT CONTROLLED WITH YOUR NAUSEA MEDICATION  UNUSUAL SHORTNESS OF BREATH  UNUSUAL BRUISING OR BLEEDING  TENDERNESS IN MOUTH AND THROAT WITH OR WITHOUT PRESENCE OF ULCERS  URINARY PROBLEMS  BOWEL PROBLEMS  UNUSUAL RASH    Wear comfortable clothing and clothing appropriate for easy access to any Portacath or PICC line. Let us know  if there is anything that we can do to make your therapy better!     I have been informed and understand all of the instructions given to me and have received a copy. I have been instructed to call the clinic [phone] or my family physician as soon as possible for continued medical care, if indicated. I do not have any more questions at this time but understand that I may call the Cancer Center or the Patient Navigator at [phone] during office hours should I have questions or need assistance in obtaining follow-up care.      _________________________________________      _______________     __________ Signature of Patient or Authorized Representative        Date                            Time      _________________________________________ Nurse's Signature

## 2011-06-14 NOTE — Progress Notes (Signed)
Addended by: Oda Kilts on: 06/14/2011 02:33 PM   Modules accepted: Orders

## 2011-06-17 ENCOUNTER — Ambulatory Visit (HOSPITAL_COMMUNITY): Payer: BC Managed Care – PPO

## 2011-06-17 ENCOUNTER — Ambulatory Visit (HOSPITAL_BASED_OUTPATIENT_CLINIC_OR_DEPARTMENT_OTHER)
Admission: RE | Admit: 2011-06-17 | Discharge: 2011-06-17 | Disposition: A | Discharge status: Home / Self Care | Payer: BC Managed Care – PPO | Source: Ambulatory Visit | Attending: General Surgery | Admitting: General Surgery

## 2011-06-17 ENCOUNTER — Ambulatory Visit (HOSPITAL_BASED_OUTPATIENT_CLINIC_OR_DEPARTMENT_OTHER): Payer: BC Managed Care – PPO | Admitting: Anesthesiology

## 2011-06-17 ENCOUNTER — Encounter (HOSPITAL_BASED_OUTPATIENT_CLINIC_OR_DEPARTMENT_OTHER): Payer: Self-pay | Admitting: *Deleted

## 2011-06-17 ENCOUNTER — Encounter (HOSPITAL_BASED_OUTPATIENT_CLINIC_OR_DEPARTMENT_OTHER): Payer: Self-pay | Admitting: Anesthesiology

## 2011-06-17 ENCOUNTER — Encounter (HOSPITAL_BASED_OUTPATIENT_CLINIC_OR_DEPARTMENT_OTHER): Payer: Self-pay | Admitting: Certified Registered"

## 2011-06-17 ENCOUNTER — Encounter (HOSPITAL_BASED_OUTPATIENT_CLINIC_OR_DEPARTMENT_OTHER)
Admission: RE | Disposition: A | Discharge status: Home / Self Care | Payer: Self-pay | Source: Ambulatory Visit | Attending: General Surgery

## 2011-06-17 DIAGNOSIS — C50919 Malignant neoplasm of unspecified site of unspecified female breast: Secondary | ICD-10-CM

## 2011-06-17 DIAGNOSIS — Z01812 Encounter for preprocedural laboratory examination: Secondary | ICD-10-CM | POA: Insufficient documentation

## 2011-06-17 HISTORY — DX: Anxiety disorder, unspecified: F41.9

## 2011-06-17 HISTORY — PX: PORTACATH PLACEMENT: SHX2246

## 2011-06-17 HISTORY — DX: Other complications of anesthesia, initial encounter: T88.59XA

## 2011-06-17 HISTORY — DX: Adverse effect of unspecified anesthetic, initial encounter: T41.45XA

## 2011-06-17 SURGERY — INSERTION, TUNNELED CENTRAL VENOUS DEVICE, WITH PORT
Anesthesia: Monitor Anesthesia Care | Laterality: Left

## 2011-06-17 MED ORDER — FENTANYL CITRATE 0.05 MG/ML IJ SOLN
INTRAMUSCULAR | Status: DC | PRN
  Administered 2011-06-17: 25 ug via INTRAVENOUS
  Administered 2011-06-17: 100 ug via INTRAVENOUS

## 2011-06-17 MED ORDER — HEPARIN SOD (PORK) LOCK FLUSH 100 UNIT/ML IV SOLN
INTRAVENOUS | Status: DC | PRN
  Administered 2011-06-17: 500 [IU] via INTRAVENOUS

## 2011-06-17 MED ORDER — MIDAZOLAM HCL 5 MG/5ML IJ SOLN
INTRAMUSCULAR | Status: DC | PRN
  Administered 2011-06-17: 2 mg via INTRAVENOUS

## 2011-06-17 MED ORDER — LIDOCAINE HCL (CARDIAC) 20 MG/ML IV SOLN
INTRAVENOUS | Status: DC | PRN
  Administered 2011-06-17: 40 mg via INTRAVENOUS

## 2011-06-17 MED ORDER — PROPOFOL 10 MG/ML IV EMUL
INTRAVENOUS | Status: DC | PRN
  Administered 2011-06-17: 200 mg via INTRAVENOUS

## 2011-06-17 MED ORDER — HYDROCODONE-ACETAMINOPHEN 5-500 MG PO TABS: 1.0000 | ORAL_TABLET | Freq: Four times a day (QID) | ORAL | Status: AC | PRN

## 2011-06-17 MED ORDER — CLINDAMYCIN PHOSPHATE 600 MG/50ML IV SOLN
INTRAVENOUS | Status: DC | PRN
  Administered 2011-06-17: 600 mg via INTRAVENOUS

## 2011-06-17 MED ORDER — PROMETHAZINE HCL 25 MG/ML IJ SOLN: 6.2500 mg | INTRAMUSCULAR | Status: DC | PRN

## 2011-06-17 MED ORDER — CLINDAMYCIN PHOSPHATE 600 MG/50ML IV SOLN
600.0000 mg | Freq: Once | INTRAVENOUS | Status: AC
  Administered 2011-06-17: 600 mg via INTRAVENOUS

## 2011-06-17 MED ORDER — MEPERIDINE HCL 25 MG/ML IJ SOLN: 6.2500 mg | INTRAMUSCULAR | Status: DC | PRN

## 2011-06-17 MED ORDER — MORPHINE SULFATE 2 MG/ML IJ SOLN: 0.0500 mg/kg | INTRAMUSCULAR | Status: DC | PRN

## 2011-06-17 MED ORDER — DEXAMETHASONE SODIUM PHOSPHATE 4 MG/ML IJ SOLN
INTRAMUSCULAR | Status: DC | PRN
  Administered 2011-06-17: 10 mg via INTRAVENOUS

## 2011-06-17 MED ORDER — ONDANSETRON HCL 4 MG/2ML IJ SOLN
INTRAMUSCULAR | Status: DC | PRN
  Administered 2011-06-17: 4 mg via INTRAVENOUS

## 2011-06-17 MED ORDER — HYDROMORPHONE HCL PF 1 MG/ML IJ SOLN: 0.2500 mg | INTRAMUSCULAR | Status: DC | PRN

## 2011-06-17 MED ORDER — LIDOCAINE-EPINEPHRINE (PF) 1 %-1:200000 IJ SOLN
INTRAMUSCULAR | Status: DC | PRN
  Administered 2011-06-17: 09:00:00 via INTRAMUSCULAR

## 2011-06-17 MED ORDER — LACTATED RINGERS IV SOLN
INTRAVENOUS | Status: DC
  Administered 2011-06-17 (×2): via INTRAVENOUS

## 2011-06-17 SURGICAL SUPPLY — 39 items
BAG DECANTER FOR FLEXI CONT (MISCELLANEOUS) ×2 IMPLANT
BLADE SURG 11 STRL SS (BLADE) ×2 IMPLANT
BLADE SURG 15 STRL LF DISP TIS (BLADE) ×1 IMPLANT
BLADE SURG 15 STRL SS (BLADE) ×1
CHLORAPREP W/TINT 26ML (MISCELLANEOUS) ×2 IMPLANT
COVER KIT LFREE 14X147CM (MISCELLANEOUS) ×2 IMPLANT
COVER MAYO STAND STRL (DRAPES) ×2 IMPLANT
COVER TABLE BACK 60X90 (DRAPES) ×2 IMPLANT
DECANTER SPIKE VIAL GLASS SM (MISCELLANEOUS) ×2 IMPLANT
DERMABOND ADVANCED (GAUZE/BANDAGES/DRESSINGS) ×1
DERMABOND ADVANCED .7 DNX12 (GAUZE/BANDAGES/DRESSINGS) ×1 IMPLANT
DRAPE C-ARM 42X72 X-RAY (DRAPES) ×2 IMPLANT
DRAPE PED LAPAROTOMY (DRAPES) ×2 IMPLANT
DRAPE UTILITY XL STRL (DRAPES) ×2 IMPLANT
ELECT COATED BLADE 2.86 ST (ELECTRODE) ×2 IMPLANT
ELECT REM PT RETURN 9FT ADLT (ELECTROSURGICAL) ×2
ELECTRODE REM PT RTRN 9FT ADLT (ELECTROSURGICAL) ×1 IMPLANT
GLOVE INDICATOR 8.0 STRL GRN (GLOVE) ×2 IMPLANT
GOWN PREVENTION PLUS XLARGE (GOWN DISPOSABLE) ×2 IMPLANT
GOWN PREVENTION PLUS XXLARGE (GOWN DISPOSABLE) ×2 IMPLANT
IV HEPARIN 1000UNITS/500ML (IV SOLUTION) ×2 IMPLANT
IV KIT MINILOC 20X1 SAFETY (NEEDLE) IMPLANT
KIT POWER CATH 8FR (Catheter) IMPLANT
NEEDLE HYPO 25X1 1.5 SAFETY (NEEDLE) ×2 IMPLANT
PACK BASIN DAY SURGERY FS (CUSTOM PROCEDURE TRAY) ×2 IMPLANT
PENCIL BUTTON HOLSTER BLD 10FT (ELECTRODE) ×2 IMPLANT
PORT OPEN END 8FR 1 LUMEN (CATHETERS) ×2 IMPLANT
SET SHEATH INTRODUCER 10FR (MISCELLANEOUS) IMPLANT
SHEATH COOK PEEL AWAY SET 9F (SHEATH) IMPLANT
SLEEVE SCD COMPRESS KNEE MED (MISCELLANEOUS) ×2 IMPLANT
SUT MON AB 4-0 PC3 18 (SUTURE) ×2 IMPLANT
SUT PROLENE 2 0 SH DA (SUTURE) ×2 IMPLANT
SUT VICRYL 3-0 CR8 SH (SUTURE) ×2 IMPLANT
SYR 5ML LUER SLIP (SYRINGE) ×2 IMPLANT
SYR CONTROL 10ML LL (SYRINGE) ×2 IMPLANT
TAPE STRIPS DRAPE STRL (GAUZE/BANDAGES/DRESSINGS) ×4 IMPLANT
TOWEL OR 17X24 6PK STRL BLUE (TOWEL DISPOSABLE) ×4 IMPLANT
TOWEL OR NON WOVEN STRL DISP B (DISPOSABLE) ×2 IMPLANT
WATER STERILE IRR 1000ML POUR (IV SOLUTION) IMPLANT

## 2011-06-17 NOTE — Anesthesia Preprocedure Evaluation (Addendum)
Anesthesia Evaluation  Patient identified by MRN, date of birth, ID band Patient awake    Airway       Dental   Pulmonary asthma , sleep apnea ,          Cardiovascular neg cardio ROS     Neuro/Psych Anxiety Negative Neurological ROS     GI/Hepatic negative GI ROS, Neg liver ROS,   Endo/Other    Renal/GU negative Renal ROS     Musculoskeletal   Abdominal   Peds  Hematology negative hematology ROS (+)   Anesthesia Other Findings   Reproductive/Obstetrics                          Anesthesia Physical Anesthesia Plan  ASA: II  Anesthesia Plan: MAC   Post-op Pain Management:    Induction: Intravenous  Airway Management Planned: Mask  Additional Equipment:   Intra-op Plan:   Post-operative Plan:   Informed Consent: I have reviewed the patients History and Physical, chart, labs and discussed the procedure including the risks, benefits and alternatives for the proposed anesthesia with the patient or authorized representative who has indicated his/her understanding and acceptance.     Plan Discussed with: CRNA  Anesthesia Plan Comments:         Anesthesia Quick Evaluation

## 2011-06-17 NOTE — Progress Notes (Signed)
Addended by: Oda Kilts on: 06/17/2011 12:43 PM   Modules accepted: Orders

## 2011-06-17 NOTE — Transfer of Care (Signed)
Immediate Anesthesia Transfer of Care Note  Patient: Renee Serrano  Procedure(s) Performed:  INSERTION PORT-A-CATH - left side mediport placement  Patient Location: PACU  Anesthesia Type: General  Level of Consciousness: awake, alert , oriented and patient cooperative  Airway & Oxygen Therapy: Patient Spontanous Breathing and Patient connected to face mask oxygen  Post-op Assessment: Report given to PACU RN and Post -op Vital signs reviewed and stable  Post vital signs: Reviewed and stable  Complications: No apparent anesthesia complications

## 2011-06-17 NOTE — H&P (View-Only) (Signed)
Renee Serrano  MR#: [id number]    History of present illness: The patient is a 56-year-old [place], [place] woman who had routine screening mammography in [place] 03/26/2011.. There was a new approximately 3 cm mass in the upper outer quadrant of the right breast and she was recalled for diagnostic mammography October 30, with ultrasonography. Spot magnification views confirmed a 3 cm mass with spiculated irregular margins, measuring 2.4 cm by ultrasonography. It was solid. Ultrasound-guided biopsy was performed 05/07/2011 and this showed ([id number] at [facility] [facility]) and invasive ductal carcinoma, grade 3, triple negative, with a key 67 of 3+.  With this information the patient was presented at the multidisciplinary breast cancer conference 05/29/2011, and was seen at the clinic the same day.  Past medical history:      Past Medical History  Diagnosis Date  . DVT (deep venous thrombosis)   . Sinus drainage   . Sleep apnea     Hyperlipidemia, restless legs,   Past surgical history:      Past Surgical History  Procedure Date  . Functional endoscopic sinus surgery   . Abdominal hysterectomy     Family history:     Family History  Problem Relation Age of Onset  . Cancer Mother     colon  . Cancer Maternal Grandmother     breast   . Cancer Cousin     breast  . Cancer Cousin     breast  . Cancer Maternal Aunt     colon  . Cancer Maternal Aunt     breast    the patient's father died at the age of 87 from complications of lymphoma. The patient's mother died at the age of 92. She had been diagnosed with colon cancer approximately age 60. The patient had one brother and one sister. There is no breast or ovarian cancer in the immediate family. However one of the patient's mother's 5 sisters had ovarian cancer and breast cancer. Another one of her mother's 5 sisters had 2 daughters (the patient's cousins was (with breast cancer. The youngest of the 2 was diagnosed at age 48. There is  also a significant family history of colon cancer.  Gynecologic history: GX P3, first pregnancy to term age 23, menarche age 12. She underwent hysterectomy without salpingo-oophorectomy in 2004.    Social history:  She teaches second grade. Her husband Michael, present today, work for the textile industry. Her son Anthony, 32, lives in [place] where he worked as a machinist. Son Steven 26 lips and Advil works in sales. Son Robert 20 at [employer]. The patient is to grandchildren and one on the way due July. She attends the local [employer]     ADVANCED DIRECTIVES: in place  Health maintenance:       History  Substance Use Topics  . Smoking status: Never Smoker   . Smokeless tobacco: Not on file  . Alcohol Use: 0.6 - 1.2 oz/week    1-2 Glasses of wine per week      Colonoscopy: 2009, under Dr Patel in [place]  PAP: 2004  Bone density: > 10 years ago, "normal"  Cholesterol: under treatment  Review of systems:  She feels tired. She has been very anxious for nearly 2 months now since the first inkling of this problem, trying to get it resolved. She has pain in the right breast at times, describes this as throbbing and soreness. She bruises easily. Otherwise a detailed review of systems was unremarkable.--- I should   add that there was no location she is aware of for her prior lower extremity DVT. She does not know whether a coagulopathy panel was sent at that time.  Allergies:     Allergies  Allergen Reactions  . Penicillins Hives    Medications:      Current Outpatient Prescriptions  Medication Sig Dispense Refill  . Ascorbic Acid (VITAMIN C WITH ROSE HIPS) 500 MG tablet Take 500 mg by mouth daily.        . aspirin 81 MG tablet Take 81 mg by mouth daily.        . cholecalciferol (VITAMIN D) 400 UNITS TABS Take 1,000 Units by mouth.        . citalopram (CELEXA) 40 MG tablet Take 40 mg by mouth daily.        . montelukast (SINGULAIR) 10 MG tablet Take 10 mg by mouth  at bedtime.        . Multiple Vitamin (MULTIVITAMIN) tablet Take 1 tablet by mouth daily.        . rOPINIRole (REQUIP) 0.5 MG tablet Take 1 mg by mouth at bedtime.        . simvastatin (ZOCOR) 40 MG tablet Take 40 mg by mouth at bedtime.          Physical exam:      Filed Vitals:   05/29/11 0843  BP: 163/82  Pulse: 76  Temp: 98.3 F (36.8 C)     Body mass index is 34.91 kg/(m^2).   ECOG performance status: 0  Oropharynx: Clear  Lungs: No crackles or wheezes  Heart regular rate and rhythm, no murmur appreciated  Right breast: Status post recent biopsy; there is a moderate ecchymosis; I do not palpated well defined mass;  Left breast: No suspicious masses  Abdomen: Soft nontender positive bowel sounds  Musculoskeletal exam no focal spinal tenderness, no peripheral edema  Neurological exam: Nonfocal.  Adenopathy: The right axilla in particular is negative to palpation  Lab results:            Chemistry      Component Value Date/Time   NA 138 05/29/2011 0822   K 3.8 05/29/2011 0822   CL 102 05/29/2011 0822   CO2 28 05/29/2011 0822   BUN 18 05/29/2011 0822   CREATININE 0.72 05/29/2011 0822      Component Value Date/Time   CALCIUM 9.8 05/29/2011 0822   ALKPHOS 74 05/29/2011 0822   AST 17 05/29/2011 0822   ALT 17 05/29/2011 0822   BILITOT 0.2* 05/29/2011 0822         Lab Results  Component Value Date   WBC 6.9 05/29/2011   HGB 13.6 05/29/2011   HCT 40.3 05/29/2011   MCV 92.9 05/29/2011   PLT 285 05/29/2011   NEUTROABS 3.9 05/29/2011    Studies:      As already discussed.   Assessment: 56-year-old [place], [place] woman status post right breast biopsy November 2012 for a clinical T2 N0 (stage II) invasive ductal carcinoma, triple negative, with an elevated MIB-1; with a provocative family cancer history, and a history of unprovoked lower extremity DVT in 2000.       Plan: We spent the better part of her 1 hour visit today at discussing the biology  of her tumor, the significance of a triple negative tumor, and her choices. She understands that in terms of survival mastectomy versus lumpectomy plus radiation are equivalent. She also understands that neoadjuvant versus adjuvant chemotherapy produces the same survival results.    As far as her breast cancer is concerned a recommendation was that she start with neoadjuvant chemotherapy, specifically cyclophosphamide/doxorubicin in dose dense fashion x4 followed by paclitaxel in dose dense fashion x4, to be followed by definitive surgery. She will benefit from staging with a chest CT and bone scan and she will also have an echocardiogram. We're scheduling her to meet with our genetic counselor and also with "chemotherapy school" here.  She would much prefer to be treated in [place], and we will try to get her an appointment in early January with Dr. Eric Neijstrom. She will have a port placed by Dr. Layton prior to that visit. The patient probably should have a hypercoagulable panel drawn at some point at Dr. Nestor's discretion.   Zenola Dezarn C 05/29/2011      

## 2011-06-17 NOTE — Op Note (Signed)
NAME:  Renee Serrano, Renee Serrano NO.:  [id number]  MEDICAL RECORD NO.:  [id number]  LOCATION:                                 FACILITY:  PHYSICIAN:  Lodema Pilot, MD       DATE OF BIRTH:  05-07-1956  DATE OF PROCEDURE:  06/17/2011 DATE OF DISCHARGE:                              OPERATIVE REPORT   PROCEDURE:  Ultrasound-guided left subclavian Mediport placement.  PREOPERATIVE DIAGNOSIS:  Right breast cancer.  POSTOPERATIVE DIAGNOSIS:  Right breast cancer.  SURGEON:  Lodema Pilot, MD  ASSISTANT:  None.  ANESTHESIA:  General LMA anesthesia, 30 mL of 1% lidocaine with epinephrine and 0.25% Marcaine in a 50:50 mixture.  FLUIDS:  1200 mL of crystalloid.  ESTIMATED BLOOD LOSS:  Minimal.  DRAINS:  An 8-French MRI compatible power port placed in left subclavian vein.  SPECIMENS:  None.  COMPLICATIONS:  None apparent.  FINDINGS:  Ultrasound-guided placement in the left subclavian vein of an 8-French MRI compatible power port.  INDICATION FOR PROCEDURE:  Ms. Renee Serrano is 56 year old female with a recently diagnosed triple negative right breast cancer, who is planning neoadjuvant chemotherapy prior to definitive surgical management.  OPERATIVE DETAILS:  Ms. Renee Serrano was seen and evaluated the preoperative area and risks and benefits of the procedure were again discussed in lay terms.  Informed consent was obtained.  Prophylactic antibiotics were given and the surgical site was marked prior to anesthetic administration.  She has taken to the operating room, placed on table in supine position and  general LMA anesthesia was obtained.  Her arms were tucked bilaterally and her left chest and neck were prepped and draped in standard surgical fashion.  Procedure time-out was performed with all operative team members to confirm proper patient, procedure, and a high- frequency ultrasound probe was used to identify the location of the subclavian artery and vein.  The vein was  identified and noted to be compressible, actually fairly clearly visualized given the location of the clavicle.  I marked this area with a skin marker and using the access needle, I accessed the vein on the first attempt.  Nonpulsatile venous-appearing dark blood returned.  The guidewire was passed into the superior vena cava and confirmed with fluoroscopic guidance and the access needle was removed.  It was clipped to the drape.  Then, subcutaneous pocket was created inferior to the access site, and using Bovie electrocautery, the pocket was enlarged superficial to the pectoral fascia in order to accommodate placement of the subcutaneous reservoir.  Two 2-0 Prolene sutures were placed to the pectoral fascia and through the 10 o'clock and 2 o'clock position on the port.  However, the port was not secured to the fascia at this time.  Sutures were left intact and the access site was enlarged and the catheter was tunneled through the subcutaneous tissue to the newly created subcutaneous pocket.  Then, under fluoroscopic guidance, the vein was dilated over the guide wire, again under fluoroscopic guidance.  The dilator and guide were removed, and the introducer sheath was left in place and the catheter was passed into the vein through the introducer sheath into the atrium and the introducer sheath was  peeled away, leaving the catheter in place.  Then under fluoroscopic guidance, again, catheter was pulled back into the proper position and appeared to be the atrial caval junction.  Then, the catheter was trimmed and secured to the port reservoir and locked into place.  The port was then tacked to the fascia with previously placed sutures, and the port was accessed with again non- pulsatile venous-appearing blood and was flushed with heparinized saline.  Again, fluoroscopy was used to confirm a gentle curve in the catheter and the catheter tip appeared to be in proper position at the atrial  caval junction.  The port was accessed again with brisk venous- appearing blood, and final flush of the heparinized saline was administered.  The pocket was injected with a total of 30 mL of 1% lidocaine with epinephrine and 0.25% Marcaine in a 50:50 mixture and the wound was inspected for hemostasis which was noted to be adequate.  The dermis was approximated with a 3-0 Vicryl dermal suture and the skin was approximated with 4-0 Monocryl subcuticular suture.  Both the reservoir site and the access site of the skin was washed and dried and Dermabond was applied.  All sponge, needle, instrument counts correct at the end of case.  The patient tolerated the procedure well without apparent complication.  She was stable and ready for transfer to the recovery room in stable condition where postprocedural x-ray was to be obtained. Results are pending at this time.          ______________________________ Lodema Pilot, MD     BL/MEDQ  D:  06/17/2011  T:  06/17/2011  Job:  [id number]

## 2011-06-17 NOTE — Brief Op Note (Signed)
06/17/2011  9:04 AM  PATIENT:  Renee Serrano  56 y.o. female  PRE-OPERATIVE DIAGNOSIS:  right breast cancer  POST-OPERATIVE DIAGNOSIS:  right breast cancer  PROCEDURE:  Procedure(s): INSERTION PORT-A-CATH  SURGEON:  Surgeon(s): Rulon Abide, DO  PHYSICIAN ASSISTANT:   ASSISTANTS: none   ANESTHESIA:   general  EBL:  Total I/O In: 1000 [I.V.:1000] Out: -   BLOOD ADMINISTERED:none  DRAINS: none and 84F powerport in left subclavian vein   LOCAL MEDICATIONS USED:  MARCAINE 15CC and LIDOCAINE 15CC  SPECIMEN:  No Specimen  DISPOSITION OF SPECIMEN:  N/A  COUNTS:  YES  TOURNIQUET:  * No tourniquets in log *  DICTATION: .Other Dictation: Dictation Number [id number]  PLAN OF CARE: Discharge to home after PACU  PATIENT DISPOSITION:  PACU - hemodynamically stable.   Delay start of Pharmacological VTE agent (>24hrs) due to surgical blood loss or risk of bleeding:  {YES/NO/NOT APPLICABLE:20182

## 2011-06-17 NOTE — Anesthesia Procedure Notes (Signed)
Procedure Name: LMA Insertion Date/Time: 06/17/2011 7:51 AM Performed by: Radford Pax Pre-anesthesia Checklist: Patient identified, Emergency Drugs available, Suction available, Patient being monitored and Timeout performed Patient Re-evaluated:Patient Re-evaluated prior to inductionOxygen Delivery Method: Circle System Utilized Preoxygenation: Pre-oxygenation with 100% oxygen Intubation Type: IV induction Ventilation: Mask ventilation without difficulty LMA: LMA inserted LMA Size: 4.0 Number of attempts: 1 (atraumatic) Airway Equipment and Method: bite block (bite gard on right) Placement Confirmation: positive ETCO2 Tube secured with: Tape (clear tape used) Dental Injury: Teeth and Oropharynx as per pre-operative assessment  Comments: #9 crown in good condition

## 2011-06-17 NOTE — Interval H&P Note (Signed)
History and Physical Interval Note:  06/17/2011 7:30 AM  Twanna Hy  has presented today for surgery, with the diagnosis of right breast cancer  The various methods of treatment have been discussed with the patient and family. After consideration of risks, benefits and other options for treatment, the patient has consented to  Procedure(s): INSERTION PORT-A-CATH as a surgical intervention .  The patients' history has been reviewed, patient examined, no change in status, stable for surgery.  I have reviewed the patients' chart and labs.  Questions were answered to the patient's satisfaction.  Site marked and risks of the procedure again discussed in lay terms. We discussed the risks of infection, bleeding, pain, scarring, need for open, pneumothorax, hemothorax, arrhythmia, and port malfunction and she desires to proceed with mediport placment.  Clip was placed last week in the right breast in the tumor.   Lodema Pilot DAVID

## 2011-06-17 NOTE — Anesthesia Postprocedure Evaluation (Signed)
  Anesthesia Post-op Note  Patient: Renee Serrano  Procedure(s) Performed:  INSERTION PORT-A-CATH - left side mediport placement  Patient Location: PACU  Anesthesia Type: General  Level of Consciousness: awake  Airway and Oxygen Therapy: Patient Spontanous Breathing  Post-op Pain: mild  Post-op Assessment: Post-op Vital signs reviewed  Post-op Vital Signs: stable  Complications: No apparent anesthesia complications

## 2011-06-18 ENCOUNTER — Encounter (HOSPITAL_BASED_OUTPATIENT_CLINIC_OR_DEPARTMENT_OTHER): Payer: Self-pay | Admitting: General Surgery

## 2011-06-18 ENCOUNTER — Encounter (HOSPITAL_BASED_OUTPATIENT_CLINIC_OR_DEPARTMENT_OTHER): Payer: BC Managed Care – PPO

## 2011-06-18 DIAGNOSIS — Z5111 Encounter for antineoplastic chemotherapy: Secondary | ICD-10-CM

## 2011-06-18 DIAGNOSIS — C50419 Malignant neoplasm of upper-outer quadrant of unspecified female breast: Secondary | ICD-10-CM

## 2011-06-18 LAB — CBC
HCT: 35.8 % — ABNORMAL LOW (ref 36.0–46.0)
MCH: 30.8 pg (ref 26.0–34.0)
MCHC: 33.5 g/dL (ref 30.0–36.0)
RDW: 12.8 % (ref 11.5–15.5)

## 2011-06-18 LAB — DIFFERENTIAL
Basophils Absolute: 0 10*3/uL (ref 0.0–0.1)
Basophils Relative: 0 % (ref 0–1)
Eosinophils Absolute: 0.2 10*3/uL (ref 0.0–0.7)
Eosinophils Relative: 1 % (ref 0–5)
Monocytes Absolute: 0.9 10*3/uL (ref 0.1–1.0)
Neutro Abs: 8.4 10*3/uL — ABNORMAL HIGH (ref 1.7–7.7)

## 2011-06-18 LAB — COMPREHENSIVE METABOLIC PANEL
AST: 17 U/L (ref 0–37)
Albumin: 3.7 g/dL (ref 3.5–5.2)
Calcium: 9.7 mg/dL (ref 8.4–10.5)
Creatinine, Ser: 0.68 mg/dL (ref 0.50–1.10)
Total Protein: 7.3 g/dL (ref 6.0–8.3)

## 2011-06-18 MED ORDER — PALONOSETRON HCL INJECTION 0.25 MG/5ML
INTRAVENOUS | Status: AC
  Administered 2011-06-18: 0.25 mg via INTRAVENOUS
  Filled 2011-06-18: qty 5

## 2011-06-18 MED ORDER — HEPARIN SOD (PORK) LOCK FLUSH 100 UNIT/ML IV SOLN
INTRAVENOUS | Status: AC
  Administered 2011-06-18: 500 [IU]
  Filled 2011-06-18: qty 5

## 2011-06-18 MED ORDER — SODIUM CHLORIDE 0.9 % IV SOLN
Freq: Once | INTRAVENOUS | Status: AC
  Administered 2011-06-18: 12:00:00 via INTRAVENOUS

## 2011-06-18 MED ORDER — SODIUM CHLORIDE 0.9 % IJ SOLN
INTRAMUSCULAR | Status: AC
  Administered 2011-06-18: 10 mL
  Filled 2011-06-18: qty 10

## 2011-06-18 MED ORDER — PALONOSETRON HCL INJECTION 0.25 MG/5ML
0.2500 mg | Freq: Once | INTRAVENOUS | Status: AC
  Administered 2011-06-18: 0.25 mg via INTRAVENOUS

## 2011-06-18 MED ORDER — SODIUM CHLORIDE 0.9 % IJ SOLN
10.0000 mL | INTRAMUSCULAR | Status: DC | PRN
  Administered 2011-06-18: 10 mL
  Filled 2011-06-18: qty 10

## 2011-06-18 MED ORDER — DOXORUBICIN HCL CHEMO IV INJECTION 2 MG/ML
60.0000 mg/m2 | Freq: Once | INTRAVENOUS | Status: AC
  Administered 2011-06-18: 130 mg via INTRAVENOUS
  Filled 2011-06-18 (×3): qty 65

## 2011-06-18 MED ORDER — DEXAMETHASONE SODIUM PHOSPHATE 4 MG/ML IJ SOLN: 12.0000 mg | Freq: Once | INTRAMUSCULAR | Status: DC

## 2011-06-18 MED ORDER — SODIUM CHLORIDE 0.9 % IV SOLN
Freq: Once | INTRAVENOUS | Status: AC
  Administered 2011-06-18: 12:00:00 via INTRAVENOUS
  Filled 2011-06-18: qty 5

## 2011-06-18 MED ORDER — HEPARIN SOD (PORK) LOCK FLUSH 100 UNIT/ML IV SOLN
500.0000 [IU] | Freq: Once | INTRAVENOUS | Status: AC | PRN
  Administered 2011-06-18: 500 [IU]
  Filled 2011-06-18: qty 5

## 2011-06-18 MED ORDER — SODIUM CHLORIDE 0.9 % IV SOLN
600.0000 mg/m2 | Freq: Once | INTRAVENOUS | Status: AC
  Administered 2011-06-18: 1300 mg via INTRAVENOUS
  Filled 2011-06-18 (×2): qty 65

## 2011-06-18 MED ORDER — SODIUM CHLORIDE 0.9 % IV SOLN: 150.0000 mg | Freq: Once | INTRAVENOUS | Status: DC

## 2011-06-18 NOTE — Progress Notes (Signed)
Tolerated chemo fairly well. Expressed some anxiety.Marland Kitchen

## 2011-06-18 NOTE — Progress Notes (Signed)
Chemo teaching done and consent signed. Hand written calendar given to patient on chemo schedule and how to take meds.

## 2011-06-19 ENCOUNTER — Encounter (HOSPITAL_BASED_OUTPATIENT_CLINIC_OR_DEPARTMENT_OTHER): Payer: BC Managed Care – PPO

## 2011-06-19 ENCOUNTER — Inpatient Hospital Stay (HOSPITAL_COMMUNITY): Payer: BC Managed Care – PPO

## 2011-06-19 VITALS — BP 149/86 | HR 72 | Temp 97.5°F

## 2011-06-19 DIAGNOSIS — C50419 Malignant neoplasm of upper-outer quadrant of unspecified female breast: Secondary | ICD-10-CM

## 2011-06-19 DIAGNOSIS — Z5189 Encounter for other specified aftercare: Secondary | ICD-10-CM

## 2011-06-19 LAB — CANCER ANTIGEN 27.29: CA 27.29: 36 U/mL (ref 0–39)

## 2011-06-19 MED ORDER — PEGFILGRASTIM INJECTION 6 MG/0.6ML
SUBCUTANEOUS | Status: AC
  Administered 2011-06-19: 6 mg via SUBCUTANEOUS
  Filled 2011-06-19: qty 0.6

## 2011-06-19 MED ORDER — PEGFILGRASTIM INJECTION 6 MG/0.6ML
6.0000 mg | Freq: Once | SUBCUTANEOUS | Status: AC
  Administered 2011-06-19: 6 mg via SUBCUTANEOUS

## 2011-06-19 NOTE — Progress Notes (Signed)
Tolerated injection well.  Stated she did pretty good after chemo yesterday,

## 2011-06-20 ENCOUNTER — Encounter: Payer: Self-pay | Admitting: Oncology

## 2011-06-20 NOTE — Progress Notes (Signed)
Refaxed patient's fmla papers to [facility] attn: Verl Dicker @ [phone].

## 2011-06-27 ENCOUNTER — Encounter (INDEPENDENT_AMBULATORY_CARE_PROVIDER_SITE_OTHER): Payer: Self-pay | Admitting: Oncology

## 2011-06-28 ENCOUNTER — Encounter (HOSPITAL_BASED_OUTPATIENT_CLINIC_OR_DEPARTMENT_OTHER): Payer: BC Managed Care – PPO | Admitting: Oncology

## 2011-06-28 ENCOUNTER — Other Ambulatory Visit (HOSPITAL_COMMUNITY): Payer: Self-pay | Admitting: Oncology

## 2011-06-28 VITALS — BP 135/82 | HR 83 | Temp 98.3°F | Wt 215.8 lb

## 2011-06-28 DIAGNOSIS — C50419 Malignant neoplasm of upper-outer quadrant of unspecified female breast: Secondary | ICD-10-CM

## 2011-06-28 DIAGNOSIS — B009 Herpesviral infection, unspecified: Secondary | ICD-10-CM

## 2011-06-28 DIAGNOSIS — R110 Nausea: Secondary | ICD-10-CM

## 2011-06-28 DIAGNOSIS — E669 Obesity, unspecified: Secondary | ICD-10-CM

## 2011-06-28 NOTE — Progress Notes (Signed)
This office note has been dictated.

## 2011-06-28 NOTE — Progress Notes (Signed)
Addended by: Sterling Big on: 06/28/2011 03:26 PM   Modules accepted: Orders

## 2011-06-28 NOTE — Progress Notes (Signed)
CC:   Lodema Pilot, MD Maryln Gottron, M.D.  DIAGNOSES: 1. Right upper outer quadrant triple negative breast cancer clinically     5 x 7 cm in size when I saw her on 06/12/2011 with abnormal     appearing nodes in the axilla and along the chest wall there was     either a lymph node or extension of the tumor to the pectorals     major, also confirmed by MRI of the breast as well as the CT scan. 2. History of asthma as a child with recurrence after her third     pregnancy. 3. Sleep apnea on a CPAP machine. 4. New occurrence of a herpes simplex like lesion on the upper lip     after cycle 1 of chemotherapy 9 days ago, it is resolving. 5. Mild obesity. 6. Hysterectomy in 2004 for benign reasons. 7. History of a deep venous thrombosis with a negative workup in 2000,     and she was treated with Coumadin for 6 months. 8. History of hyperlipidemia. 9. History of RLS (restless leg syndrome). 10.Breast cancer in her family, namely her mother's mother had breast     cancer.  Her mother herself had colon cancer.  Mother's sister had     colon cancer with recurrence x2.  Another sister died of ovarian     cancer in her 44s, and then there are 2 maternal aunts, 2 children     who have had breast cancer, and 1 was a triple negative breast     cancer actually in her late 108s, and these 2 children have been     tested by the genetics clinic at [facility], and have a unique     gene abnormality and they have called Twanna Hy and want her to     be tested at [facility] which I have encouraged.  Jnae is here today after her first cycle of therapy which went very, very well.  She had a little queasiness, a little bit of nausea at most. No vomiting.  She is a little weak and tired but she is starting to walk more.  She misses work but the side effects from the chemo were minimal. She also notes that the tumor in her breast has stopped hurting.  It stopped several days after the chemotherapy  was administered.  This lady's tumor was 4 cm by MRI.  Clinically when I saw her it was 5 x 7 cm, and when Dr. Hanisch Russell saw her in consultation I believe he also felt that it was about 4 cm clinically.  So she has a tumor which probably rapidly progressed in my opinion clinically and indeed had a lymph node biopsy prior to the start of chemotherapy which was negative interestingly.  Her scans, however, were very, very worrisome for lymph node involvement extending down to the chest wall either by lymph node or by direct tumor extension.  She was told she states that she may not need radiation therapy but I want to discuss her case with Dr. Dayton Scrape because I suspect she is a candidate for it whether she has a mastectomy or lumpectomy.  She states she is leaning towards a mastectomy presently.  PHYSICAL EXAMINATION:  Her exam today though she looks good.  She has stable vital signs.  Her weight is 215 pounds.  Blood pressure 135/82 left arm sitting position, pulse 80 and regular, respirations 16 and 18 and unlabored.  She  is afebrile.  She complains of no pain presently. There is a small 2 mm area in the upper lip where this appears to be consistent with a herpetic lesion.  She has put a little Neosporin on it she states which is why it is shiny.  She has no nodes in the cervical, supraclavicular, infraclavicular, axillary areas.  Lungs are clear. Heart shows a regular rhythm and rate.  I deferred a breast exam until after 2 cycles.  Abdomen is soft, nontender.  Bowel sounds are normal. She has no arm edema.  She was having leg aching after the Neulasta shot, and we have told her she can use some Aleve 1-2 every 8 hours or some Motrin 2 or 3 every 4 hours p.r.n. for this discomfort.  She looks good.  We will go with cycle 2 next week.  We will set her up for 2D echo after cycle 3 to make sure there has not been a drop in her ejection fraction, and I have put a call in to Dr. Sagar Russell who will call me on Monday  since he is off today.    ______________________________ Ladona Horns. Mariel Sleet, MD ESN/MEDQ  D:  06/28/2011  T:  06/28/2011  Job:  [id number]

## 2011-06-28 NOTE — Patient Instructions (Addendum)
Renee Serrano  [id number] 12/15/1955   [facility]  Discharge Instructions  RECOMMENDATIONS MADE BY THE CONSULTANT AND ANY TEST RESULTS WILL BE SENT TO YOUR REFERRING DOCTOR.   EXAM FINDINGS BY MD TODAY AND SIGNS AND SYMPTOMS TO REPORT TO CLINIC OR PRIMARY MD: You are doing extremely well after your first treatment.  The neuslasta is probably what made your legs hurt.  You can use aleve or motrin for the discomfort.  If you have another cold sore let us know.  We may need to put you on something prophylactically. Dr. Mariel Sleet will talk with Dr. Dayton Scrape about radiation.  MEDICATIONS PRESCRIBED: none   INSTRUCTIONS GIVEN AND DISCUSSED: Other :  Report any fevers, chills, shortness of breath, etc.  SPECIAL INSTRUCTIONS/FOLLOW-UP: Other (Referral/Appointments) :  As scheduled for chemotherapy and to see Dr. Mariel Sleet in 2 weeks to make sure you are continuing to doing well.   I acknowledge that I have been informed and understand all the instructions given to me and received a copy. I do not have any more questions at this time, but understand that I may call the Specialty Clinic at [facility] at [phone] during business hours should I have any further questions or need assistance in obtaining follow-up care.    __________________________________________  _____________  __________ Signature of Patient or Authorized Representative            Date                   Time    __________________________________________ Nurse's Signature

## 2011-07-01 ENCOUNTER — Encounter: Payer: Self-pay | Admitting: Radiation Oncology

## 2011-07-01 NOTE — Progress Notes (Signed)
Chart note:  I discussed the patient's management with Dr. Mariel Sleet  this morning. Her pre-chemotherapy MRI scan and staging CT scan show suspicious subpectoral and axillary involvement despite having a negative actually node biopsy. Based on her presentation, I think she should be considered for post mastectomy radiotherapy to her right chest wall and regional lymph nodes following neoadjuvant chemotherapy and modified radical mastectomy.

## 2011-07-02 ENCOUNTER — Encounter (HOSPITAL_BASED_OUTPATIENT_CLINIC_OR_DEPARTMENT_OTHER): Payer: BC Managed Care – PPO

## 2011-07-02 ENCOUNTER — Encounter (INDEPENDENT_AMBULATORY_CARE_PROVIDER_SITE_OTHER): Payer: Self-pay

## 2011-07-02 VITALS — BP 139/81 | HR 70 | Temp 97.6°F | Wt 212.8 lb

## 2011-07-02 DIAGNOSIS — C50419 Malignant neoplasm of upper-outer quadrant of unspecified female breast: Secondary | ICD-10-CM

## 2011-07-02 DIAGNOSIS — Z5111 Encounter for antineoplastic chemotherapy: Secondary | ICD-10-CM

## 2011-07-02 LAB — COMPREHENSIVE METABOLIC PANEL
Alkaline Phosphatase: 92 U/L (ref 39–117)
BUN: 12 mg/dL (ref 6–23)
CO2: 28 mEq/L (ref 19–32)
GFR calc Af Amer: >90 mL/min (ref >90)
GFR calc non Af Amer: >90 mL/min (ref >90)
Glucose, Bld: 98 mg/dL (ref 70–99)
Potassium: 3.8 mEq/L (ref 3.5–5.1)
Total Protein: 6.9 g/dL (ref 6.0–8.3)

## 2011-07-02 LAB — CBC
Hemoglobin: 12 g/dL (ref 12.0–15.0)
MCH: 30.9 pg (ref 26.0–34.0)
MCV: 93 fL (ref 78.0–100.0)
RBC: 3.88 MIL/uL (ref 3.87–5.11)
WBC: 7.6 10*3/uL (ref 4.0–10.5)

## 2011-07-02 LAB — DIFFERENTIAL
Eosinophils Absolute: 0.1 10*3/uL (ref 0.0–0.7)
Eosinophils Relative: 1 % (ref 0–5)
Lymphocytes Relative: 23 % (ref 12–46)
Lymphs Abs: 1.8 10*3/uL (ref 0.7–4.0)
Monocytes Relative: 7 % (ref 3–12)
Neutrophils Relative %: 68 % (ref 43–77)

## 2011-07-02 LAB — CANCER ANTIGEN 27.29: CA 27.29: 43 U/mL — ABNORMAL HIGH (ref 0–39)

## 2011-07-02 MED ORDER — SODIUM CHLORIDE 0.9 % IV SOLN
Freq: Once | INTRAVENOUS | Status: AC
  Administered 2011-07-02: 10:00:00 via INTRAVENOUS

## 2011-07-02 MED ORDER — SODIUM CHLORIDE 0.9 % IV SOLN
Freq: Once | INTRAVENOUS | Status: AC
  Administered 2011-07-02: 11:00:00 via INTRAVENOUS
  Filled 2011-07-02: qty 5

## 2011-07-02 MED ORDER — HEPARIN SOD (PORK) LOCK FLUSH 100 UNIT/ML IV SOLN
INTRAVENOUS | Status: AC
  Filled 2011-07-02: qty 5

## 2011-07-02 MED ORDER — HEPARIN SOD (PORK) LOCK FLUSH 100 UNIT/ML IV SOLN
500.0000 [IU] | Freq: Once | INTRAVENOUS | Status: AC | PRN
  Administered 2011-07-02: 500 [IU]
  Filled 2011-07-02: qty 5

## 2011-07-02 MED ORDER — PALONOSETRON HCL INJECTION 0.25 MG/5ML
0.2500 mg | Freq: Once | INTRAVENOUS | Status: AC
  Administered 2011-07-02: 0.25 mg via INTRAVENOUS

## 2011-07-02 MED ORDER — DOXORUBICIN HCL CHEMO IV INJECTION 2 MG/ML
60.0000 mg/m2 | Freq: Once | INTRAVENOUS | Status: AC
  Administered 2011-07-02: 130 mg via INTRAVENOUS
  Filled 2011-07-02: qty 65

## 2011-07-02 MED ORDER — DEXAMETHASONE SODIUM PHOSPHATE 4 MG/ML IJ SOLN: 12.0000 mg | Freq: Once | INTRAMUSCULAR | Status: DC

## 2011-07-02 MED ORDER — SODIUM CHLORIDE 0.9 % IV SOLN: 150.0000 mg | Freq: Once | INTRAVENOUS | Status: DC

## 2011-07-02 MED ORDER — SODIUM CHLORIDE 0.9 % IV SOLN
600.0000 mg/m2 | Freq: Once | INTRAVENOUS | Status: AC
  Administered 2011-07-02: 1300 mg via INTRAVENOUS
  Filled 2011-07-02: qty 65

## 2011-07-02 MED ORDER — PALONOSETRON HCL INJECTION 0.25 MG/5ML
INTRAVENOUS | Status: AC
  Filled 2011-07-02: qty 5

## 2011-07-03 ENCOUNTER — Encounter (HOSPITAL_BASED_OUTPATIENT_CLINIC_OR_DEPARTMENT_OTHER): Payer: BC Managed Care – PPO

## 2011-07-03 VITALS — BP 139/80 | HR 86 | Temp 98.5°F

## 2011-07-03 DIAGNOSIS — Z5189 Encounter for other specified aftercare: Secondary | ICD-10-CM

## 2011-07-03 DIAGNOSIS — C50419 Malignant neoplasm of upper-outer quadrant of unspecified female breast: Secondary | ICD-10-CM

## 2011-07-03 MED ORDER — PEGFILGRASTIM INJECTION 6 MG/0.6ML
SUBCUTANEOUS | Status: AC
  Filled 2011-07-03: qty 0.6

## 2011-07-03 MED ORDER — PEGFILGRASTIM INJECTION 6 MG/0.6ML
6.0000 mg | Freq: Once | SUBCUTANEOUS | Status: AC
  Administered 2011-07-03: 6 mg via SUBCUTANEOUS

## 2011-07-03 NOTE — Progress Notes (Signed)
Tolerated injection well. 

## 2011-07-12 ENCOUNTER — Telehealth (HOSPITAL_COMMUNITY): Payer: Self-pay

## 2011-07-12 NOTE — Telephone Encounter (Signed)
Complains of rash on scalp, hair falling out, and itching on stomach, arms and perineal area.  Denies fevers or any vaginal discharge.  States "I've had problems in the past with yeast infections and this is how I feel when I have one."  Wants to know what to do.

## 2011-07-12 NOTE — Telephone Encounter (Signed)
Discussed itching with Dr. Mariel Sleet and prescription for Diflucan 100 mg 1 po daily X 7 days called into

## 2011-07-15 ENCOUNTER — Encounter (HOSPITAL_COMMUNITY): Payer: Self-pay | Admitting: Oncology

## 2011-07-15 ENCOUNTER — Ambulatory Visit (HOSPITAL_COMMUNITY): Payer: BC Managed Care – PPO | Admitting: Oncology

## 2011-07-15 ENCOUNTER — Encounter (HOSPITAL_COMMUNITY): Payer: BC Managed Care – PPO | Attending: Oncology | Admitting: Oncology

## 2011-07-15 DIAGNOSIS — C50419 Malignant neoplasm of upper-outer quadrant of unspecified female breast: Secondary | ICD-10-CM

## 2011-07-15 DIAGNOSIS — Z86718 Personal history of other venous thrombosis and embolism: Secondary | ICD-10-CM

## 2011-07-15 LAB — CBC
HCT: 36.8 % (ref 36.0–46.0)
MCH: 30.3 pg (ref 26.0–34.0)
MCHC: 32.6 g/dL (ref 30.0–36.0)
RDW: 12.9 % (ref 11.5–15.5)

## 2011-07-15 LAB — DIFFERENTIAL
Lymphocytes Relative: 18 % (ref 12–46)
Lymphs Abs: 1.6 10*3/uL (ref 0.7–4.0)
Monocytes Relative: 9 % (ref 3–12)
Neutro Abs: 6.5 10*3/uL (ref 1.7–7.7)
Neutrophils Relative %: 73 % (ref 43–77)

## 2011-07-15 LAB — COMPREHENSIVE METABOLIC PANEL
Albumin: 3.7 g/dL (ref 3.5–5.2)
Alkaline Phosphatase: 97 U/L (ref 39–117)
BUN: 13 mg/dL (ref 6–23)
Calcium: 9.7 mg/dL (ref 8.4–10.5)
GFR calc Af Amer: >90 mL/min (ref >90)
Glucose, Bld: 88 mg/dL (ref 70–99)
Potassium: 4.1 mEq/L (ref 3.5–5.1)
Total Protein: 7.2 g/dL (ref 6.0–8.3)

## 2011-07-15 LAB — CANCER ANTIGEN 27.29: CA 27.29: 47 U/mL — ABNORMAL HIGH (ref 0–39)

## 2011-07-15 NOTE — Progress Notes (Signed)
This office note has been dictated.

## 2011-07-15 NOTE — Progress Notes (Signed)
CC:   Renee Pilot, MD Renee Serrano, M.D.  DIAGNOSES: 1. Right upper quadrant triple negative breast cancer that was     clinically 5 x 7 cm when I first saw her on 06/12/2011.  She also     had CT with abnormal nodes in the axilla and along the chest wall     where there is either lymph node or extension tumor to the     pectorals major and also confirmed by the MRI of the breast. 2. History of asthma as a child with recurrence after third pregnancy. 3. Sleep apnea on a CPAP machine. 4. Mild obesity. 5. History of hysterectomy in 2004. 6. Deep venous thrombosis with a negative workup in 2000, treated with     Coumadin for 6 months at that time. 7. History of hyperlipidemia. 8. Restless legs syndrome. 9. History of breast cancer in her family, namely her mother's mother     had breast cancer.  Her mother, herself, had colon cancer.  Her     mother's sister had colon cancer with recurrence x2.  Another     sister died of ovarian cancer in her 71s.  There is a maternal     aunt's with 2 children; one who had ductal carcinoma in situ and     the other has had triple negative breast cancer in her late 85s     seen at [facility]. 10.BRCA1 and BRCA2 negativity, but she may be seen by the geneticist     for Lynch syndrome.  Renee Serrano has called her and told her that     she does not have BRCA1 or BRCA2. After this family history     discussion, they want her tested for the Lynch syndrome and that     blood has been sent.  INTERVAL HISTORY:  Renee Serrano does not have pain in the breast anymore.  She also thinks the lump is not as large.  She is accompanied today by her husband, Renee Serrano.  She has had 2 cycles of chemotherapy.  She is due for her third cycle tomorrow.  Each treatment is going very well.  She has lost her hair. She is a little weak and tired but otherwise doing well.  She had a little bit of a rash, she states, over the weekend, actually last week, and we called in  Diflucan, but the Diflucan is contraindicated with her Celexa due to QT prolongation.  So, she did not take that and she just lathered her skin with a lotion and she is much better.  All I see today is punctate-like little lesions; 4 or 5 in the suprapubic area, 1 or 2 underneath the left breast area, and nothing else other than dry skin.  She has, of course, seen the radiation therapist, Dr. Dayton Serrano.  She would like to stay with him and be treated in [place].  She will see Dr. Biagio Serrano once we get into her dose-dense Taxol.  PHYSICAL EXAMINATION:  General Appearance:  She looks very good today. Vital Signs:  Her vital signs are very stable.  Blood pressure 133/82 in the right arm, sitting position.  Pulse 72 and regular.  Respirations 16 and unlabored.  She is afebrile.  She denies any pain.  Lymph Node Exam: Negative in the cervical, supraclavicular, infraclavicular, axillary, and inguinal areas.  She is in no acute distress.  She is alert.  She is oriented.  HEENT:  She is wearing a cap where she has  lost her hair, she states.  Neck Exam:  Negative.  Lungs:  Clear.  Heart:  Regular rhythm and rate without murmur, rub, or gallop.  Breasts:  The left breast is negative for masses.  The right breast still has a 4 x 4 cm mass right around the 9 o'clock position, but, again, no axillary nodes.  This definitely feels softer without question and much less large as far as size is concerned.  Abdomen:  Soft and nontender without organomegaly or masses.  Bowel sounds are normal.  She has no peripheral edema.  PLAN:  She is a little constipated, she states, so I have encouraged her to take 200 mg of Colace three times a day if needed.  She can go up to 400 mg three times a day and use a little Milk of Magnesia, which she has used in the past for her constipation issues.  Renee Serrano, my PA, will see her in 2 weeks.  I will see her in 4 weeks and then we will get her other regimen started,  namely the dose-dense Taxol.  We will plan on getting her back to see Dr. Biagio Serrano after she has had 2 doses.  I think she looks good.  She may or may not have the option of keeping her breast based upon her response.  That remains to be seen.  She will probably need an MRI to look at that chest wall area.  A mastectomy may or may not do her any better since this chest wall area is still worrisome and margins may not be able to be cleared potentially if there is lot of disease left behind.  So, I think we just need to reassess her.  I think we need to set her up with an MRI, probably right at the completion of her dose-dense Taxol.    ______________________________ Renee Serrano. Renee Sleet, MD ESN/MEDQ  D:  07/15/2011  T:  07/15/2011  Job:  [id number]

## 2011-07-15 NOTE — Patient Instructions (Signed)
[facility]  Discharge Instructions  RECOMMENDATIONS MADE BY THE CONSULTANT AND ANY TEST RESULTS WILL BE SENT TO YOUR REFERRING DOCTOR.   EXAM FINDINGS BY MD TODAY AND SIGNS AND SYMPTOMS TO REPORT TO CLINIC OR PRIMARY MD: discussed with Dr. Mariel Sleet  Treatment tomorrow.  I acknowledge that I have been informed and understand all the instructions given to me and received a copy. I do not have any more questions at this time, but understand that I may call the Specialty Clinic at [facility] at [phone] during business hours should I have any further questions or need assistance in obtaining follow-up care.    __________________________________________  _____________  __________ Signature of Patient or Authorized Representative            Date                   Time    __________________________________________ Nurse's Signature

## 2011-07-16 ENCOUNTER — Encounter (HOSPITAL_BASED_OUTPATIENT_CLINIC_OR_DEPARTMENT_OTHER): Payer: BC Managed Care – PPO

## 2011-07-16 VITALS — BP 152/89 | HR 76 | Temp 98.2°F | Ht 65.0 in | Wt 210.6 lb

## 2011-07-16 DIAGNOSIS — C50419 Malignant neoplasm of upper-outer quadrant of unspecified female breast: Secondary | ICD-10-CM

## 2011-07-16 DIAGNOSIS — Z5111 Encounter for antineoplastic chemotherapy: Secondary | ICD-10-CM

## 2011-07-16 MED ORDER — DEXAMETHASONE SODIUM PHOSPHATE 4 MG/ML IJ SOLN: 12.0000 mg | Freq: Once | INTRAMUSCULAR | Status: DC

## 2011-07-16 MED ORDER — SODIUM CHLORIDE 0.9 % IV SOLN
Freq: Once | INTRAVENOUS | Status: AC
  Administered 2011-07-16: 09:00:00 via INTRAVENOUS

## 2011-07-16 MED ORDER — HEPARIN SOD (PORK) LOCK FLUSH 100 UNIT/ML IV SOLN
500.0000 [IU] | Freq: Once | INTRAVENOUS | Status: AC | PRN
  Administered 2011-07-16: 500 [IU]
  Filled 2011-07-16: qty 5

## 2011-07-16 MED ORDER — SODIUM CHLORIDE 0.9 % IV SOLN
Freq: Once | INTRAVENOUS | Status: AC
  Administered 2011-07-16: 10:00:00 via INTRAVENOUS
  Filled 2011-07-16: qty 5

## 2011-07-16 MED ORDER — PALONOSETRON HCL INJECTION 0.25 MG/5ML
INTRAVENOUS | Status: AC
  Filled 2011-07-16: qty 5

## 2011-07-16 MED ORDER — PALONOSETRON HCL INJECTION 0.25 MG/5ML
0.2500 mg | Freq: Once | INTRAVENOUS | Status: AC
  Administered 2011-07-16: 0.25 mg via INTRAVENOUS

## 2011-07-16 MED ORDER — HEPARIN SOD (PORK) LOCK FLUSH 100 UNIT/ML IV SOLN
INTRAVENOUS | Status: AC
  Filled 2011-07-16: qty 5

## 2011-07-16 MED ORDER — SODIUM CHLORIDE 0.9 % IV SOLN
600.0000 mg/m2 | Freq: Once | INTRAVENOUS | Status: AC
  Administered 2011-07-16: 1300 mg via INTRAVENOUS
  Filled 2011-07-16: qty 65

## 2011-07-16 MED ORDER — SODIUM CHLORIDE 0.9 % IV SOLN: 150.0000 mg | Freq: Once | INTRAVENOUS | Status: DC

## 2011-07-16 MED ORDER — DOXORUBICIN HCL CHEMO IV INJECTION 2 MG/ML
60.0000 mg/m2 | Freq: Once | INTRAVENOUS | Status: AC
  Administered 2011-07-16: 130 mg via INTRAVENOUS
  Filled 2011-07-16: qty 65

## 2011-07-16 NOTE — Progress Notes (Signed)
Tolerated chemo well. 

## 2011-07-17 ENCOUNTER — Encounter (HOSPITAL_BASED_OUTPATIENT_CLINIC_OR_DEPARTMENT_OTHER): Payer: BC Managed Care – PPO

## 2011-07-17 VITALS — BP 138/83 | HR 76 | Temp 97.9°F

## 2011-07-17 DIAGNOSIS — C50419 Malignant neoplasm of upper-outer quadrant of unspecified female breast: Secondary | ICD-10-CM

## 2011-07-17 DIAGNOSIS — Z5189 Encounter for other specified aftercare: Secondary | ICD-10-CM

## 2011-07-17 MED ORDER — PEGFILGRASTIM INJECTION 6 MG/0.6ML
SUBCUTANEOUS | Status: AC
  Filled 2011-07-17: qty 0.6

## 2011-07-17 MED ORDER — PEGFILGRASTIM INJECTION 6 MG/0.6ML
6.0000 mg | Freq: Once | SUBCUTANEOUS | Status: AC
  Administered 2011-07-17: 6 mg via SUBCUTANEOUS

## 2011-07-17 NOTE — Progress Notes (Signed)
Renee Serrano presents today for injection per MD orders. Neulasta 6mg  administered SQ in left Abdomen. Administration without incident. Patient tolerated well.

## 2011-07-23 ENCOUNTER — Ambulatory Visit (HOSPITAL_COMMUNITY)
Admission: RE | Admit: 2011-07-23 | Discharge: 2011-07-23 | Disposition: A | Discharge status: Home / Self Care | Payer: BC Managed Care – PPO | Source: Ambulatory Visit | Attending: Oncology | Admitting: Oncology

## 2011-07-23 ENCOUNTER — Other Ambulatory Visit (HOSPITAL_COMMUNITY): Payer: BC Managed Care – PPO

## 2011-07-23 DIAGNOSIS — C50919 Malignant neoplasm of unspecified site of unspecified female breast: Secondary | ICD-10-CM | POA: Insufficient documentation

## 2011-07-23 DIAGNOSIS — Z9221 Personal history of antineoplastic chemotherapy: Secondary | ICD-10-CM | POA: Insufficient documentation

## 2011-07-23 DIAGNOSIS — I517 Cardiomegaly: Secondary | ICD-10-CM

## 2011-07-23 NOTE — Progress Notes (Signed)
*  PRELIMINARY RESULTS* Echocardiogram 2D Echocardiogram has been performed.  Renee Serrano 07/23/2011, 9:33 AM2

## 2011-07-29 ENCOUNTER — Encounter (HOSPITAL_BASED_OUTPATIENT_CLINIC_OR_DEPARTMENT_OTHER): Payer: BC Managed Care – PPO | Admitting: Oncology

## 2011-07-29 ENCOUNTER — Encounter (HOSPITAL_COMMUNITY): Payer: BC Managed Care – PPO

## 2011-07-29 VITALS — BP 126/80 | HR 78 | Temp 98.8°F | Wt 215.1 lb

## 2011-07-29 DIAGNOSIS — C50419 Malignant neoplasm of upper-outer quadrant of unspecified female breast: Secondary | ICD-10-CM

## 2011-07-29 LAB — COMPREHENSIVE METABOLIC PANEL
ALT: 18 U/L (ref 0–35)
AST: 16 U/L (ref 0–37)
CO2: 27 mEq/L (ref 19–32)
Calcium: 10.1 mg/dL (ref 8.4–10.5)
Chloride: 103 mEq/L (ref 96–112)
Creatinine, Ser: 0.62 mg/dL (ref 0.50–1.10)
GFR calc Af Amer: >90 mL/min (ref >90)
GFR calc non Af Amer: >90 mL/min (ref >90)
Glucose, Bld: 87 mg/dL (ref 70–99)
Total Bilirubin: 0.1 mg/dL — ABNORMAL LOW (ref 0.3–1.2)

## 2011-07-29 LAB — DIFFERENTIAL
Basophils Absolute: 0.1 10*3/uL (ref 0.0–0.1)
Basophils Relative: 1 % (ref 0–1)
Eosinophils Relative: 0 % (ref 0–5)
Lymphocytes Relative: 12 % (ref 12–46)
Monocytes Absolute: 1 10*3/uL (ref 0.1–1.0)
Neutro Abs: 7.2 10*3/uL (ref 1.7–7.7)

## 2011-07-29 LAB — CBC
Hemoglobin: 11.3 g/dL — ABNORMAL LOW (ref 12.0–15.0)
MCH: 31.2 pg (ref 26.0–34.0)
MCV: 92.3 fL (ref 78.0–100.0)
RBC: 3.62 MIL/uL — ABNORMAL LOW (ref 3.87–5.11)
WBC: 9.3 10*3/uL (ref 4.0–10.5)

## 2011-07-29 LAB — CANCER ANTIGEN 27.29: CA 27.29: 40 U/mL — ABNORMAL HIGH (ref 0–39)

## 2011-07-29 NOTE — Patient Instructions (Signed)
[facility]  Discharge Instructions  RECOMMENDATIONS MADE BY THE CONSULTANT AND ANY TEST RESULTS WILL BE SENT TO YOUR REFERRING DOCTOR.   EXAM FINDINGS BY MD TODAY AND SIGNS AND SYMPTOMS TO REPORT TO CLINIC OR PRIMARY MD: Exam good and Tom is not able to feel lump at all. Pre- chemo labs done today and will plan on # 4 [facility] tomorrow. SPECIAL INSTRUCTIONS/FOLLOW-UP: Return to Clinic in 2 weeks to see Dr. Mariel Sleet   I acknowledge that I have been informed and understand all the instructions given to me and received a copy. I do not have any more questions at this time, but understand that I may call the Specialty Clinic at [facility] at [phone] during business hours should I have any further questions or need assistance in obtaining follow-up care.    __________________________________________  _____________  __________ Signature of Patient or Authorized Representative            Date                   Time    __________________________________________ Nurse's Signature

## 2011-07-29 NOTE — Progress Notes (Signed)
Labs drawn today for cbc,diff,cmp,[id number] 

## 2011-07-29 NOTE — Progress Notes (Signed)
No primary provider on file. No primary provider on file.  1. Cancer of upper-outer quadrant of female breast  Cancer antigen 27.29, Cancer antigen 27.29, CBC, CBC, Comprehensive metabolic panel, Comprehensive metabolic panel, Differential, Differential    CURRENT THERAPY: Status post 3 cycles of Cytoxan and Adriamycin. She is due for cycle 4 tomorrow (07/30/2011). She will then and embark on dose dense paclitaxel.  INTERVAL HISTORY: Renee Serrano 56 y.o. 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  We spent a significant amount time today discussing a few of her questions. I answered all questions appropriately. The patient and her husband question the role of mastectomy versus lumpectomy. I explained to the patient that her situation we'll likely require a mastectomy. The real question that they have is considering bilateral mastectomies.  We spent some time discussing this option. I've asked her to have this discussion with Dr. Mariel Serrano in her next followup visit and also her surgeon.  I explained to the patient that I can understand her thought process and I cannot disagree with that at this point in time. However I did explain to her that I think this is an important conversation with Dr. Mariel Serrano and her surgeon. She reports that she simply would like the other breast removed to decrease her risk of cancer in the other breast in the future. I did specimen time and patient going over potential side effects and lifelong implications of this intervention.  We also spent some time going over the most common side effects of Taxol therapy which she will embark on following her fourth cycle of Adriamycin and Cytoxan chemotherapy which is scheduled for tomorrow. The patient that there is a risk of nausea and vomiting, but the be a side effect that paclitaxel is known for is its peripheral  neuropathy. I spent some time explaining the signs and symptoms of this. She understands that she will need to let us know if she is experiencing any of these symptoms.  I personally reviewed and went over laboratory results with the patient.  The patient wished to discuss her CA27.29 results. After reviewing this laboratory results, it appears as though her CA 27. 29 is stable although it typically is mildly elevated.  I explained to the patient with this implies which may be a poor cancer marker. We spent some time discussing this. She understands that her cancer marker may not exactly correlate with her disease. I told her that we will continue monitoring her cancer marker but if the trend is not improved, we may deem her to have a poor cancer marker that does not correlate with her disease.  I personally reviewed and went over radiographic studies with the patient.  Her 2-D echocardiogram was well within normal limits and her ejection fraction was estimated to be 55-60%. Her baseline ejection fraction is 65%. I spent some time going over how the cardiologist estimate this ejection fraction. There is some bias associated with this. I suspect that her ejection fraction has truly not changed but the reader of the echocardiogram estimated her ejection fraction slightly differently. I told the patient that we typically watch for a significant decrease in ejection fraction. The husband was confused as to what and ejection fraction is, so I spent a significant time going over the role of the left ventricle and its associated ejection fraction.  The patient questions whether she may be able  to work while she receives radiation therapy. I explained to her that this is a good discussion have with her radiation oncologist. However, depending on side effects she experiences with radiation, she may be able to work part-time. She will  followup with that question with her radiation oncologist.  The patient also posed  the question, "what will be done following the completion of chemotherapy, surgery, and radiation."  I explained to the patient that we will be performing regular breast exams and screening mammograms. She wonders if there is maintenance chemotherapy for her cancer.  I explained to the patient that due to her estrogen and progesterone status and HER-2 negativity, unfortunately there is no medication for her as a maintenance drug. This of course concerns the patient due to the quick onset of her current breast cancer.  The patient admits to some sores in her mouth but she is utilizing her Biotene mouthwash appropriately. She illustrates that she has 1 small lesion on her inside lower lip. The patient also notes a small lesion on the bridge of her nose from her CPAP machine. She reports it is much improved over the past few days. She says that she has not used her CPAP machine in the past few days to allow this lesion to heal and she is doing quite well. Patient reports that she is exercising and utilizing a treadmill please 30 minutes a day.  The patient Mrs. she is very fortunate that she has not had any nausea or vomiting. I explained to the patient I do not expect her to have any increase of nausea and vomiting with the Taxol therapy.  Past Medical History  Diagnosis Date  . DVT (deep venous thrombosis)   . Sinus drainage   . Complication of anesthesia     paniced first surg-had to sedate prior to surg  . Anxiety   . DVT (deep venous thrombosis) 2000    does not know why-takes asa  . Allergy   . Asthma   . Cancer 2012    rt. breast ca dx-05/2011  . Breast cancer   . Sleep apnea     does use cpap    has Cancer of upper-outer quadrant of female breast on her problem list.     is allergic to penicillins.  Renee Serrano does not currently have medications on file.  Past Surgical History  Procedure Date  . Functional endoscopic sinus surgery   . Abdominal hysterectomy   . Colonoscopy     . Portacath placement 06/17/2011    Procedure: INSERTION PORT-A-CATH;  Surgeon: Renee Abide, DO;  Location: [facility];  Service: General;  Laterality: Left;  left side mediport placement    Denies any headaches, dizziness, double vision, fevers, chills, night sweats, nausea, vomiting, diarrhea, constipation, chest pain, heart palpitations, shortness of breath, blood in stool, black tarry stool, urinary pain, urinary burning, urinary frequency, hematuria.   PHYSICAL EXAMINATION  ECOG PERFORMANCE STATUS: 1 - Symptomatic but completely ambulatory  Filed Vitals:   07/29/11 0953  BP: 126/80  Pulse: 78  Temp: 98.8 F (37.1 C)    GENERAL:alert, no distress, well nourished, well developed, comfortable, cooperative, obese and smiling SKIN: skin color, texture, turgor are normal, no rashes or significant lesions HEAD: Normocephalic, No masses, lesions, tenderness or abnormalities EYES: normal EARS: External ears normal OROPHARYNX:mucous membranes are moist  NECK: supple, no adenopathy, no stridor, non-tender, trachea midline LYMPH:  no palpable lymphadenopathy, no hepatosplenomegaly BREAST:breasts appear normal, no suspicious masses,  no skin or nipple changes or axillary nodes LUNGS: clear to auscultation and percussion HEART: regular rate & rhythm, no murmurs, no gallops, S1 normal and S2 normal ABDOMEN:abdomen soft, non-tender, obese, normal bowel sounds, no masses or organomegaly and no hepatosplenomegaly BACK: Back symmetric, no curvature., No CVA tenderness EXTREMITIES:less then 2 second capillary refill, no joint deformities, effusion, or inflammation, no edema, no skin discoloration, no clubbing, no cyanosis  NEURO: alert & oriented x 3 with fluent speech, no focal motor/sensory deficits, gait normal   LABORATORY DATA: CBC    Component Value Date/Time   WBC 9.3 07/29/2011 1214   WBC 6.9 05/29/2011 0822   RBC 3.62* 07/29/2011 1214   RBC 4.34 05/29/2011 0822    HGB 11.3* 07/29/2011 1214   HGB 13.6 05/29/2011 0822   HCT 33.4* 07/29/2011 1214   HCT 40.3 05/29/2011 0822   PLT 198 07/29/2011 1214   PLT 285 05/29/2011 0822   MCV 92.3 07/29/2011 1214   MCV 92.9 05/29/2011 0822   MCH 31.2 07/29/2011 1214   MCH 31.3 05/29/2011 0822   MCHC 33.8 07/29/2011 1214   MCHC 33.7 05/29/2011 0822   RDW 13.6 07/29/2011 1214   RDW 12.6 05/29/2011 0822   LYMPHSABS 1.1 07/29/2011 1214   LYMPHSABS 2.0 05/29/2011 0822   MONOABS 1.0 07/29/2011 1214   MONOABS 0.5 05/29/2011 0822   EOSABS 0.0 07/29/2011 1214   EOSABS 0.5 05/29/2011 0822   BASOSABS 0.1 07/29/2011 1214   BASOSABS 0.0 05/29/2011 0822      Chemistry      Component Value Date/Time   NA 140 07/29/2011 1214   K 4.1 07/29/2011 1214   CL 103 07/29/2011 1214   CO2 27 07/29/2011 1214   BUN 13 07/29/2011 1214   CREATININE 0.62 07/29/2011 1214      Component Value Date/Time   CALCIUM 10.1 07/29/2011 1214   ALKPHOS 94 07/29/2011 1214   AST 16 07/29/2011 1214   ALT 18 07/29/2011 1214   BILITOT 0.1* 07/29/2011 1214        RADIOGRAPHIC STUDIES: 07/23/2011  Transthoracic Echocardiography  Patient: Taje, Tondreau MR #: [id number] Study Date: 07/23/2011 Gender: F Age: 82 Height: 167.6cm Weight: 95.3kg BSA: 2.66m^2 Pt. Status: Room:  SONOGRAPHER [facility] ATTENDING Dewaine Oats ORDERING Dewaine Oats REFERRING [place], Eric Sherwood PERFORMING Delma Freeze [place] cc:  ------------------------------------------------------------ LV EF: 55% - 60%  ------------------------------------------------------------ Indications: V58.11 Chemotherapy Evaluation.  ------------------------------------------------------------ History: PMH: Breast cancer on chemotherapy  ------------------------------------------------------------ Study Conclusions  - Left ventricle: The cavity size was normal. There was mild concentric hypertrophy. Systolic function was normal. The estimated  ejection fraction was in the range of 55% to 60%. Wall motion was normal; there were no regional wall motion abnormalities. - Right ventricle: The cavity size was normal. Wall thickness was mildly increased. - Atrial septum: No defect or patent foramen ovale was identified. Impressions:  - Compared to the prior study performed 06/06/11, there has been no significant interval change. Transthoracic echocardiography. M-mode, complete 2D, spectral Doppler, and color Doppler. Height: Height: 167.6cm. Height: 66in. Weight: Weight: 95.3kg. Weight: 209.6lb. Body mass index: BMI: 33.9kg/m^2. Body surface area: BSA: 2.72m^2. Patient status: Outpatient. Location: Echo laboratory.   PATHOLOGY: 06/13/2011  FINAL DIAGNOSIS Diagnosis Lymph node, needle/core biopsy, right axilla - LYMPHOID TISSUE, NO EVIDENCE OF METASTATIC CARCINOMA. Abigail Miyamoto MD Pathologist, Electronic Signature (Case signed 06/14/2011)   ASSESSMENT: 1. Right upper quadrant triple negative breast cancer that was clinically 5 x 7 cm when I first saw her on  06/12/2011. She also had CT with abnormal nodes in the axilla and along the chest wall where there is either lymph node or extension tumor to the pectorals major and also confirmed by the MRI of the breast.  2. History of asthma as a child with recurrence after third pregnancy.  3. Sleep apnea on a CPAP machine.  4. Mild obesity.  5. History of hysterectomy in 2004.  6. Deep venous thrombosis with a negative workup in 2000, treated with Coumadin for 6 months at that time.  7. History of hyperlipidemia.  8. Restless legs syndrome.  9. History of breast cancer in her family, namely her mother's mother had breast cancer. Her mother, herself, had colon cancer. Her mother's sister had colon cancer with recurrence x2. Another sister died of ovarian cancer in her 54s. There is a maternal aunt's with 2 children; one who had ductal carcinoma in situ and the other has had triple  negative breast cancer in her late 32s  seen at [facility].  10.BRCA1 and BRCA2 negativity, but she may be seen by the geneticist for Lynch syndrome. [name] has called her and told her that she does not have BRCA1 or BRCA2. After this family history discussion, they want her tested for the Lynch syndrome and that blood has been sent.   PLAN:  1. MRI of Breast at conclusion of Taxol chemotherapy. This was not ordered at today's visit.  Will schedule closer to conclusion of chemotherapy.  2. Spent a significant amount of time with patient answering her questions about her consideration of bilateral mastectomy.  3. Discussed mastectomy vs lumpectomy and they are favoring mastectomy which is likely the best for her in light of her disease.  4. I personally reviewed and went over laboratory results with the patient. 5. I personally reviewed and went over radiographic studies with the patient. 6. Spent some time discussing her CA 27.29 7. Pre-chemo lab work today 8. Return in 2 weeks for follow-up.  All questions were answered. The patient knows to call the clinic with any problems, questions or concerns. We can certainly see the patient much sooner if necessary.  The patient and plan discussed with Glenford Peers, MD and he is in agreement with the aforementioned.  I spent 30 minutes counseling the patient face to face. The total time spent in the appointment was 40 minutes.  Renee Serrano

## 2011-07-30 ENCOUNTER — Encounter (HOSPITAL_BASED_OUTPATIENT_CLINIC_OR_DEPARTMENT_OTHER): Payer: BC Managed Care – PPO

## 2011-07-30 VITALS — BP 134/84 | HR 85 | Temp 98.7°F | Wt 213.2 lb

## 2011-07-30 DIAGNOSIS — C50419 Malignant neoplasm of upper-outer quadrant of unspecified female breast: Secondary | ICD-10-CM

## 2011-07-30 DIAGNOSIS — Z5111 Encounter for antineoplastic chemotherapy: Secondary | ICD-10-CM

## 2011-07-30 MED ORDER — PALONOSETRON HCL INJECTION 0.25 MG/5ML
INTRAVENOUS | Status: AC
  Filled 2011-07-30: qty 5

## 2011-07-30 MED ORDER — HEPARIN SOD (PORK) LOCK FLUSH 100 UNIT/ML IV SOLN
500.0000 [IU] | Freq: Once | INTRAVENOUS | Status: AC | PRN
  Administered 2011-07-30: 500 [IU]
  Filled 2011-07-30: qty 5

## 2011-07-30 MED ORDER — DOXORUBICIN HCL CHEMO IV INJECTION 2 MG/ML
60.0000 mg/m2 | Freq: Once | INTRAVENOUS | Status: AC
  Administered 2011-07-30: 130 mg via INTRAVENOUS
  Filled 2011-07-30: qty 65

## 2011-07-30 MED ORDER — PALONOSETRON HCL INJECTION 0.25 MG/5ML
0.2500 mg | Freq: Once | INTRAVENOUS | Status: AC
  Administered 2011-07-30: 0.25 mg via INTRAVENOUS

## 2011-07-30 MED ORDER — SODIUM CHLORIDE 0.9 % IV SOLN
Freq: Once | INTRAVENOUS | Status: AC
  Administered 2011-07-30: 09:00:00 via INTRAVENOUS
  Filled 2011-07-30: qty 5

## 2011-07-30 MED ORDER — SODIUM CHLORIDE 0.9 % IV SOLN
Freq: Once | INTRAVENOUS | Status: AC
  Administered 2011-07-30: 09:00:00 via INTRAVENOUS

## 2011-07-30 MED ORDER — HEPARIN SOD (PORK) LOCK FLUSH 100 UNIT/ML IV SOLN
INTRAVENOUS | Status: AC
  Filled 2011-07-30: qty 5

## 2011-07-30 MED ORDER — DEXAMETHASONE SODIUM PHOSPHATE 4 MG/ML IJ SOLN: 12.0000 mg | Freq: Once | INTRAMUSCULAR | Status: DC

## 2011-07-30 MED ORDER — SODIUM CHLORIDE 0.9 % IV SOLN: 150.0000 mg | Freq: Once | INTRAVENOUS | Status: DC

## 2011-07-30 MED ORDER — SODIUM CHLORIDE 0.9 % IJ SOLN
10.0000 mL | INTRAMUSCULAR | Status: DC | PRN
  Administered 2011-07-30: 10 mL
  Filled 2011-07-30: qty 10

## 2011-07-30 MED ORDER — CYCLOPHOSPHAMIDE CHEMO INJECTION 1 GM
600.0000 mg/m2 | Freq: Once | INTRAMUSCULAR | Status: AC
  Administered 2011-07-30: 1300 mg via INTRAVENOUS
  Filled 2011-07-30: qty 65

## 2011-07-30 NOTE — Progress Notes (Signed)
Tolerated chemo well.  D/C to home with friend.

## 2011-07-31 ENCOUNTER — Encounter (HOSPITAL_BASED_OUTPATIENT_CLINIC_OR_DEPARTMENT_OTHER): Payer: BC Managed Care – PPO

## 2011-07-31 ENCOUNTER — Ambulatory Visit (HOSPITAL_COMMUNITY): Payer: BC Managed Care – PPO | Admitting: Oncology

## 2011-07-31 VITALS — BP 156/95 | HR 97 | Temp 98.1°F

## 2011-07-31 DIAGNOSIS — C50419 Malignant neoplasm of upper-outer quadrant of unspecified female breast: Secondary | ICD-10-CM

## 2011-07-31 DIAGNOSIS — Z5189 Encounter for other specified aftercare: Secondary | ICD-10-CM

## 2011-07-31 MED ORDER — PEGFILGRASTIM INJECTION 6 MG/0.6ML
6.0000 mg | Freq: Once | SUBCUTANEOUS | Status: DC
  Administered 2011-07-31: 6 mg via SUBCUTANEOUS

## 2011-07-31 MED ORDER — PEGFILGRASTIM INJECTION 6 MG/0.6ML
SUBCUTANEOUS | Status: AC
  Filled 2011-07-31: qty 0.6

## 2011-07-31 NOTE — Progress Notes (Signed)
Renee Serrano presents today for injection per MD orders. Neulasta 6mg administered SQ in left Abdomen. Administration without incident. Patient tolerated well.  

## 2011-08-01 MED ORDER — DEXAMETHASONE 4 MG PO TABS: ORAL_TABLET | ORAL | Status: DC

## 2011-08-06 ENCOUNTER — Telehealth (HOSPITAL_COMMUNITY): Payer: Self-pay | Admitting: Oncology

## 2011-08-13 ENCOUNTER — Telehealth (INDEPENDENT_AMBULATORY_CARE_PROVIDER_SITE_OTHER): Payer: Self-pay

## 2011-08-13 ENCOUNTER — Encounter (HOSPITAL_COMMUNITY): Payer: BC Managed Care – PPO | Attending: Oncology | Admitting: Oncology

## 2011-08-13 ENCOUNTER — Encounter (HOSPITAL_BASED_OUTPATIENT_CLINIC_OR_DEPARTMENT_OTHER): Payer: BC Managed Care – PPO

## 2011-08-13 VITALS — BP 135/81 | HR 67 | Temp 98.4°F | Wt 210.8 lb

## 2011-08-13 DIAGNOSIS — Z5111 Encounter for antineoplastic chemotherapy: Secondary | ICD-10-CM

## 2011-08-13 DIAGNOSIS — J309 Allergic rhinitis, unspecified: Secondary | ICD-10-CM | POA: Insufficient documentation

## 2011-08-13 DIAGNOSIS — C50419 Malignant neoplasm of upper-outer quadrant of unspecified female breast: Secondary | ICD-10-CM

## 2011-08-13 DIAGNOSIS — R21 Rash and other nonspecific skin eruption: Secondary | ICD-10-CM | POA: Insufficient documentation

## 2011-08-13 DIAGNOSIS — Z86718 Personal history of other venous thrombosis and embolism: Secondary | ICD-10-CM

## 2011-08-13 LAB — COMPREHENSIVE METABOLIC PANEL
Alkaline Phosphatase: 103 U/L (ref 39–117)
BUN: 16 mg/dL (ref 6–23)
CO2: 25 mEq/L (ref 19–32)
Calcium: 10.1 mg/dL (ref 8.4–10.5)
GFR calc Af Amer: >90 mL/min (ref >90)
GFR calc non Af Amer: >90 mL/min (ref >90)
Glucose, Bld: 153 mg/dL — ABNORMAL HIGH (ref 70–99)
Total Protein: 7 g/dL (ref 6.0–8.3)

## 2011-08-13 LAB — CBC
HCT: 32.5 % — ABNORMAL LOW (ref 36.0–46.0)
Hemoglobin: 10.7 g/dL — ABNORMAL LOW (ref 12.0–15.0)
MCH: 31.1 pg (ref 26.0–34.0)
MCHC: 32.9 g/dL (ref 30.0–36.0)
MCV: 94.5 fL (ref 78.0–100.0)

## 2011-08-13 LAB — CANCER ANTIGEN 27.29: CA 27.29: 41 U/mL — ABNORMAL HIGH (ref 0–39)

## 2011-08-13 LAB — DIFFERENTIAL
Basophils Relative: 1 % (ref 0–1)
Eosinophils Absolute: 0 10*3/uL (ref 0.0–0.7)
Eosinophils Relative: 0 % (ref 0–5)
Monocytes Absolute: 0.3 10*3/uL (ref 0.1–1.0)
Monocytes Relative: 2 % — ABNORMAL LOW (ref 3–12)

## 2011-08-13 MED ORDER — FAMOTIDINE IN NACL 20-0.9 MG/50ML-% IV SOLN
20.0000 mg | Freq: Once | INTRAVENOUS | Status: AC
  Administered 2011-08-13: 20 mg via INTRAVENOUS

## 2011-08-13 MED ORDER — SODIUM CHLORIDE 0.9 % IV SOLN: 8.0000 mg | Freq: Once | INTRAVENOUS | Status: DC

## 2011-08-13 MED ORDER — HEPARIN SOD (PORK) LOCK FLUSH 100 UNIT/ML IV SOLN
INTRAVENOUS | Status: AC
  Filled 2011-08-13: qty 5

## 2011-08-13 MED ORDER — PACLITAXEL CHEMO INJECTION 300 MG/50ML
175.0000 mg/m2 | Freq: Once | INTRAVENOUS | Status: AC
  Administered 2011-08-13: 378 mg via INTRAVENOUS
  Filled 2011-08-13: qty 63

## 2011-08-13 MED ORDER — SODIUM CHLORIDE 0.9 % IV SOLN
Freq: Once | INTRAVENOUS | Status: AC
  Administered 2011-08-13: 8 mg via INTRAVENOUS
  Filled 2011-08-13: qty 4

## 2011-08-13 MED ORDER — DEXAMETHASONE SODIUM PHOSPHATE 4 MG/ML IJ SOLN: 20.0000 mg | Freq: Once | INTRAMUSCULAR | Status: DC

## 2011-08-13 MED ORDER — HEPARIN SOD (PORK) LOCK FLUSH 100 UNIT/ML IV SOLN
500.0000 [IU] | Freq: Once | INTRAVENOUS | Status: AC | PRN
  Administered 2011-08-13: 500 [IU]
  Filled 2011-08-13: qty 5

## 2011-08-13 MED ORDER — DIPHENHYDRAMINE HCL 50 MG/ML IJ SOLN
50.0000 mg | Freq: Once | INTRAMUSCULAR | Status: AC
  Administered 2011-08-13: 50 mg via INTRAVENOUS

## 2011-08-13 MED ORDER — FAMOTIDINE IN NACL 20-0.9 MG/50ML-% IV SOLN
INTRAVENOUS | Status: AC
  Filled 2011-08-13: qty 50

## 2011-08-13 MED ORDER — CLOTRIMAZOLE-BETAMETHASONE 1-0.05 % EX CREA: TOPICAL_CREAM | Freq: Two times a day (BID) | CUTANEOUS | Status: DC

## 2011-08-13 MED ORDER — SODIUM CHLORIDE 0.9 % IV SOLN
Freq: Once | INTRAVENOUS | Status: AC
  Administered 2011-08-13: 11:00:00 via INTRAVENOUS

## 2011-08-13 MED ORDER — DIPHENHYDRAMINE HCL 50 MG/ML IJ SOLN
INTRAMUSCULAR | Status: AC
  Filled 2011-08-13: qty 1

## 2011-08-13 MED ORDER — SODIUM CHLORIDE 0.9 % IJ SOLN
INTRAMUSCULAR | Status: AC
  Administered 2011-08-13: 10 mL
  Filled 2011-08-13: qty 20

## 2011-08-13 NOTE — Progress Notes (Signed)
First dose Taxol started per protocol at reduced infusion rate as follows: 42cc/hr x 15 mins, then 84cc/hr x 15 mins, then 125cc/hr x 15 mins, then 167cc/hr until completed. Vital signs checked per protocol. Pt voiced no complaints and no adverse reactions were noted.

## 2011-08-13 NOTE — Telephone Encounter (Signed)
Left message to call out office -- Dr. Biagio Quint would like to see Ms. Vantassell in 3 weeks (09/05/11 @ 2pm)

## 2011-08-13 NOTE — Progress Notes (Signed)
This office note has been dictated.

## 2011-08-13 NOTE — Progress Notes (Signed)
CC:   Renee Serrano, M.D. Lodema Pilot, MD  DIAGNOSES: 1. Right upper quadrant triple negative breast cancer clinically 5 x 7     cm though on MRI it was 4 cm, possibly larger.  She also had by CT     and MRI abnormal nodes in the axilla and a lesion along the chest     wall but a lymph node biopsy in the right axilla was negative for     tumor, interestingly.  She is now status post 4 cycles of dose     dense AC with what is now a very nice response and no distinctly     palpable tumor in the upper outer quadrant. 2. History of asthma as a child. 3. Sleep apnea on a CPAP machine. 4. Mild excessive weight. 5. Hysterectomy in 2004. 6. Deep venous thrombosis with a negative workup in 2000 and treated     with Coumadin for 6 months at that time. 7. History of hyperlipidemia. 8. Restless leg syndrome. 9. Family history of breast cancer, namely her mother's mother had     breast cancer.  Her mother herself had colon cancer.  Her mother's     sister had colon cancer with recurrence x2.  Another sister died of     ovarian cancer in her 66s and there is a maternal aunt with 2     children, 1 who had ductal carcinoma in situ and the other had     triple negative breast cancer in her late 73s, and the latter seen     at [facility]. 10.BRCA1 and BRCA2 negativity.  She is being evaluated at [facility],     though. Felissa does not feel the lump anymore but interestingly she has had a couple issues with chemotherapy; one is dry eyes and irritation, she is using some Replens which she needs to continue.  She also has developed almost hemangioma spider angioma like lesions, two on her leg, one elsewhere, but I am not sure why they have occurred.  They are not consistent with insect bites but they do blush quite readily like a hemangioma.  She also has a rash on her left breast which she thought was the same but it looks like Candida, just a patch about an inch across and we will treat that with  Lotrisone.  She is doing well with her bowel function.  She is eating the good foods, etc.  PHYSICAL EXAMINATION:  Vital signs:  Her vital signs show that her weight is 210 pounds.  She has no peripheral arm or leg edema.  She is afebrile, pulse is right around 80 and regular.  Respirations 16 and unlabored.  General:  She is in no acute distress.  She is alopecic. HEENT:  Exam is unremarkable.  She has no adenopathy in the cervical, supraclavicular, infraclavicular, axillary or inguinal areas.  Abdomen: Her abdomen is soft and nontender without organomegaly, without hepatosplenomegaly.  Bowel sounds are normal.  Skin:  The skin lesions have been described.  The one on the leg that is the largest, sort of hemangioma like lesion, is about 9 mm across, the other one is about 5 mm across, both on the lower leg lateral surface.  Lungs:  Her lungs are clear to auscultation and percussion.  Heart shows a regular rhythm and rate without murmur, rub or gallop.  Breasts:  Her left breast is negative for masses, Port-A-Cath is intact.  Her right breast:  I really  do not feel any distinct mass at this time.  I think there is fibroglandular tissue in the central portion of the breast that is not distinctly a mass, it just feels more like unremarkable breast issue.  So I have talked to Dr. Biagio Quint and he is going to see her in about 2-1/2 weeks and think about whether he wants to do a repeat CT of the chest or an MRI since it was very concerning that she had disease in the axilla and along the chest wall surface, but I think the other thing we need to find out, is she going to be included in a genetic study for possible Lynch syndrome in [place].  Right now, she is still leaning towards a mastectomy but she may still have the option of lumpectomy potentially with a good enough response.  I may also get her to see Dr. Dayton Scrape again or get her presented at the breast cancer tumor board in about 3  weeks to 5 weeks.  She will be finished with all therapy in 6 weeks and then could have surgery 2-3 weeks later based upon her blood counts but so far she looks very, very good and has a nice response clinically so far.    ______________________________ Ladona Horns. Mariel Sleet, MD ESN/MEDQ  D:  08/13/2011  T:  08/13/2011  Job:  [id number]

## 2011-08-14 ENCOUNTER — Encounter (HOSPITAL_BASED_OUTPATIENT_CLINIC_OR_DEPARTMENT_OTHER): Payer: BC Managed Care – PPO

## 2011-08-14 VITALS — BP 132/77 | HR 67 | Temp 98.8°F

## 2011-08-14 DIAGNOSIS — Z5189 Encounter for other specified aftercare: Secondary | ICD-10-CM

## 2011-08-14 DIAGNOSIS — C50419 Malignant neoplasm of upper-outer quadrant of unspecified female breast: Secondary | ICD-10-CM

## 2011-08-14 MED ORDER — PEGFILGRASTIM INJECTION 6 MG/0.6ML
6.0000 mg | Freq: Once | SUBCUTANEOUS | Status: AC
  Administered 2011-08-14: 6 mg via SUBCUTANEOUS

## 2011-08-14 MED ORDER — PEGFILGRASTIM INJECTION 6 MG/0.6ML
SUBCUTANEOUS | Status: AC
  Filled 2011-08-14: qty 0.6

## 2011-08-14 NOTE — Progress Notes (Signed)
Renee Serrano presents today for injection per MD orders. Neulasta 6mg  administered SQ in left Abdomen. Administration without incident. Patient tolerated well. Denies complaints post chemotherapy.

## 2011-08-26 ENCOUNTER — Encounter (HOSPITAL_BASED_OUTPATIENT_CLINIC_OR_DEPARTMENT_OTHER): Payer: BC Managed Care – PPO | Admitting: Oncology

## 2011-08-26 VITALS — BP 140/83 | HR 80 | Temp 98.5°F | Ht 65.0 in | Wt 213.0 lb

## 2011-08-26 DIAGNOSIS — C50419 Malignant neoplasm of upper-outer quadrant of unspecified female breast: Secondary | ICD-10-CM

## 2011-08-26 DIAGNOSIS — Z1509 Genetic susceptibility to other malignant neoplasm: Secondary | ICD-10-CM

## 2011-08-26 LAB — CBC
MCHC: 33.8 g/dL (ref 30.0–36.0)
RDW: 15.4 % (ref 11.5–15.5)

## 2011-08-26 LAB — DIFFERENTIAL
Basophils Absolute: 0.1 10*3/uL (ref 0.0–0.1)
Basophils Relative: 1 % (ref 0–1)
Eosinophils Absolute: 0.4 10*3/uL (ref 0.0–0.7)
Neutro Abs: 10 10*3/uL — ABNORMAL HIGH (ref 1.7–7.7)
Neutrophils Relative %: 80 % — ABNORMAL HIGH (ref 43–77)

## 2011-08-26 LAB — COMPREHENSIVE METABOLIC PANEL
AST: 54 U/L — ABNORMAL HIGH (ref 0–37)
Albumin: 3.5 g/dL (ref 3.5–5.2)
Alkaline Phosphatase: 115 U/L (ref 39–117)
Chloride: 102 mEq/L (ref 96–112)
Creatinine, Ser: 0.74 mg/dL (ref 0.50–1.10)
Potassium: 3.9 mEq/L (ref 3.5–5.1)
Total Bilirubin: 0.2 mg/dL — ABNORMAL LOW (ref 0.3–1.2)
Total Protein: 6.9 g/dL (ref 6.0–8.3)

## 2011-08-26 NOTE — Progress Notes (Signed)
No primary provider on file. No primary provider on file.  1. Cancer of upper-outer quadrant of female breast  CBC, Differential, Comprehensive metabolic panel    CURRENT THERAPY:Status post 4 cycles of Cytoxan and Adriamycin and now S/P 1 cycle of Taxol.  She is due for cycle 2 of Taxol (dose dense) tomorrow, 08/27/2011.   INTERVAL HISTORY: Renee Serrano 56 y.o. 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  The patient reports B/L LE pain for 2 days following Taxol chemotherapy.  She describes it as a constant toothache.  She reports that taking Ibuprofen every 6 hours did not provide her much relief.  Secondary to this pain, she had poor sleep.  She explains that she was treated with Taxol chemotherapy on Tuesday and experienced the pain on Thursday, Friday, and noted improvement on Saturday.  We discussed that she try Aleve TID for the next cycle.  She can also try a hydrocodone tablet every 6 hours as needed for this short interval of time to help with the discomfort.  If worse for cycle 2, will consider switching to Taxotere chemotherapy.  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  She reports that it was noticed following chemotherapy administration, but today it has resolved.  Patient education regarding this symptom provided.  She may experience this following each Taxol/Taxotere chemotherapy administration, with resolution prior to subsequent cycle.  She remains able to tie her shoe laces, button buttons, and manipulate her fingertips appropriately.  ROS: No TIA's or unusual headaches, no dysphagia.  No prolonged cough. No dyspnea or chest pain on exertion.  No abdominal pain, change in bowel habits, black or bloody stools.  No urinary tract symptoms.  No new or unusual musculoskeletal symptoms.  Normal menses, no abnormal vaginal bleeding, discharge or unexpected  pelvic pain. No new breast lumps, breast pain or nipple discharge.   Past Medical History  Diagnosis Date  . DVT (deep venous thrombosis)   . Sinus drainage   . Complication of anesthesia     paniced first surg-had to sedate prior to surg  . Anxiety   . DVT (deep venous thrombosis) 2000    does not know why-takes asa  . Allergy   . Asthma   . Cancer 2012    rt. breast ca dx-05/2011  . Breast cancer   . Sleep apnea     does use cpap    has Cancer of upper-outer quadrant of female breast on her problem list.     is allergic to penicillins.  Renee Serrano does not currently have medications on file.  Past Surgical History  Procedure Date  . Functional endoscopic sinus surgery   . Abdominal hysterectomy   . Colonoscopy   . Portacath placement 06/17/2011    Procedure: INSERTION PORT-A-CATH;  Surgeon: Rulon Abide, DO;  Location: [facility];  Service: General;  Laterality: Left;  left side mediport placement    Denies any headaches, dizziness, double vision, fevers, chills, night sweats, nausea, vomiting, diarrhea, constipation, chest pain, heart palpitations, shortness of breath, blood in stool, black tarry stool, urinary pain, urinary burning, urinary frequency, hematuria.   PHYSICAL EXAMINATION  ECOG PERFORMANCE STATUS: 1 - Symptomatic but completely ambulatory  Filed Vitals:   08/26/11 1326  BP: 140/83  Pulse: 80  Temp: 98.5 F (36.9 C)    GENERAL:alert, no distress, well nourished, well developed, comfortable,  cooperative, obese and smiling SKIN: skin color, texture, turgor are normal, no rashes or significant lesions HEAD: Normocephalic, No masses, lesions, tenderness or abnormalities EYES: normal, PERRLA, EOMI, Conjunctiva are pink and non-injected EARS: External ears normal OROPHARYNX:lips, buccal mucosa, and tongue normal and mucous membranes are moist  NECK: supple, no adenopathy, thyroid normal size, non-tender, without nodularity, no  stridor, non-tender, trachea midline LYMPH:  no palpable lymphadenopathy BREAST:not examined LUNGS: clear to auscultation and percussion HEART: regular rate & rhythm, no murmurs, no gallops, S1 normal and S2 normal ABDOMEN:abdomen soft, non-tender and normal bowel sounds BACK: Back symmetric, no curvature., No CVA tenderness EXTREMITIES:less then 2 second capillary refill, no joint deformities, effusion, or inflammation, no edema, no skin discoloration, no clubbing, no cyanosis  NEURO: alert & oriented x 3 with fluent speech, no focal motor/sensory deficits, gait normal   PENDING LABS: Pre-chemo lab work today: CBC diff, CMET/BMET   PATHOLOGY: 06/13/2011  FINAL DIAGNOSIS  Diagnosis  Lymph node, needle/core biopsy, right axilla  - LYMPHOID TISSUE, NO EVIDENCE OF METASTATIC CARCINOMA.  Renee Miyamoto MD  Pathologist, Electronic Signature  (Case signed 06/14/2011)    ASSESSMENT:  1. Right upper quadrant triple negative breast cancer that was clinically 5 x 7 cm though on MRI it was 4 cm.  She also had CT with abnormal nodes in the axilla and along the chest wall where there is either lymph node or extension tumor to the pectorals major and also confirmed by the MRI of the breast.  2. History of asthma as a child with recurrence after third pregnancy.  3. Sleep apnea on a CPAP machine.  4. Mild obesity.  5. History of hysterectomy in 2004.  6. Deep venous thrombosis with a negative workup in 2000, treated with Coumadin for 6 months at that time.  7. History of hyperlipidemia.  8. Restless legs syndrome.  9. History of breast cancer in her family, namely her mother's mother had breast cancer. Her mother, herself, had colon cancer. Her mother's sister had colon cancer with recurrence x2. Another sister died of ovarian cancer in her 44s. There is a maternal aunt's with 2 children; one who had ductal carcinoma in situ and the other has had triple negative breast cancer in her late 32s  seen  at [facility].  10.BRCA1 and BRCA2 negativity, but she may be seen by the geneticist for Lynch syndrome. Renee Serrano has called her and told her that she does not have BRCA1 or BRCA2. After this family history discussion, they want her tested for the Lynch syndrome and that blood has been sent. 11. Dry eyes, taking Replens 12. Hemangioma, unclear etiology 13. Candida, much improved with Lotrisone cream   PLAN:  1. Pre-chemo lab work today: CBC diff, BMET/CMET 2. Encouraged the patient to take Aleve one tablet three times daily as needed for LE pain following Taxol therapy.  She is welcome to try a hydrocodone every 6 hours during this short interval as well. 3. Continue Lotrisone cream for candida skin infection. 4. Asked the patient to draft a list of family members diagnosed with cancer, particularly those with Breast, Uterine, and/or Colon cancer, their age of diagnosis and death (if applicable).  She understands and will create this list. 5. She has an appointment with Dr. Biagio Quint on 09/05/2011. 6. She has an appointment with Dr. Dayton Scrape on 09/17/2011. 7. 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 8. Return in 2 weeks for follow-up.  If LE pain is worse, may  need to consider Taxotere chemotherapy as an alternative.   All questions were answered. The patient knows to call the clinic with any problems, questions or concerns. We can certainly see the patient much sooner if necessary.  The patient and plan discussed with Glenford Peers, MD and he is in agreement with the aforementioned.   Tearah Saulsbury

## 2011-08-26 NOTE — Patient Instructions (Signed)
Renee Serrano  [id number] 09-18-1955  [facility]  Discharge Instructions  RECOMMENDATIONS MADE BY THE CONSULTANT AND ANY TEST RESULTS WILL BE SENT TO YOUR REFERRING DOCTOR.   EXAM FINDINGS BY MD TODAY AND SIGNS AND SYMPTOMS TO REPORT TO CLINIC OR PRIMARY MD: Doing well.  Report any new lumps, bone pain, shortness of breath, fevers, etc.  MEDICATIONS PRESCRIBED: none     SPECIAL INSTRUCTIONS/FOLLOW-UP: Lab work Needed today and Return to Clinic as scheduled.   I acknowledge that I have been informed and understand all the instructions given to me and received a copy. I do not have any more questions at this time, but understand that I may call the Specialty Clinic at [facility] at [phone] during business hours should I have any further questions or need assistance in obtaining follow-up care.    __________________________________________  _____________  __________ Signature of Patient or Authorized Representative            Date                   Time    __________________________________________ Nurse's Signature

## 2011-08-26 NOTE — Progress Notes (Signed)
Renee Serrano presented for labwork. Labs per MD order drawn via Peripheral Line 23 gauge needle inserted in left AC  Good blood return present. Procedure without incident.  Needle removed intact. Patient tolerated procedure well.

## 2011-08-27 ENCOUNTER — Encounter (HOSPITAL_BASED_OUTPATIENT_CLINIC_OR_DEPARTMENT_OTHER): Payer: BC Managed Care – PPO

## 2011-08-27 VITALS — BP 128/79 | HR 88 | Temp 99.0°F | Wt 212.6 lb

## 2011-08-27 DIAGNOSIS — C50419 Malignant neoplasm of upper-outer quadrant of unspecified female breast: Secondary | ICD-10-CM

## 2011-08-27 DIAGNOSIS — Z5111 Encounter for antineoplastic chemotherapy: Secondary | ICD-10-CM

## 2011-08-27 MED ORDER — FAMOTIDINE IN NACL 20-0.9 MG/50ML-% IV SOLN
INTRAVENOUS | Status: AC
  Administered 2011-08-27: 20 mg via INTRAVENOUS
  Filled 2011-08-27: qty 50

## 2011-08-27 MED ORDER — FAMOTIDINE IN NACL 20-0.9 MG/50ML-% IV SOLN
20.0000 mg | Freq: Once | INTRAVENOUS | Status: AC
  Administered 2011-08-27: 20 mg via INTRAVENOUS

## 2011-08-27 MED ORDER — DEXAMETHASONE SODIUM PHOSPHATE 4 MG/ML IJ SOLN: 20.0000 mg | Freq: Once | INTRAMUSCULAR | Status: DC

## 2011-08-27 MED ORDER — SODIUM CHLORIDE 0.9 % IV SOLN: 8.0000 mg | Freq: Once | INTRAVENOUS | Status: DC

## 2011-08-27 MED ORDER — DIPHENHYDRAMINE HCL 50 MG/ML IJ SOLN
50.0000 mg | Freq: Once | INTRAMUSCULAR | Status: AC
  Administered 2011-08-27: 50 mg via INTRAVENOUS

## 2011-08-27 MED ORDER — SODIUM CHLORIDE 0.9 % IV SOLN
Freq: Once | INTRAVENOUS | Status: AC
  Administered 2011-08-27: 8 mg via INTRAVENOUS
  Filled 2011-08-27: qty 4

## 2011-08-27 MED ORDER — SODIUM CHLORIDE 0.9 % IV SOLN
Freq: Once | INTRAVENOUS | Status: AC
  Administered 2011-08-27: 500 mL via INTRAVENOUS

## 2011-08-27 MED ORDER — PACLITAXEL CHEMO INJECTION 300 MG/50ML
175.0000 mg/m2 | Freq: Once | INTRAVENOUS | Status: AC
  Administered 2011-08-27: 378 mg via INTRAVENOUS
  Filled 2011-08-27: qty 63

## 2011-08-27 MED ORDER — DIPHENHYDRAMINE HCL 50 MG/ML IJ SOLN
INTRAMUSCULAR | Status: AC
  Administered 2011-08-27: 50 mg via INTRAVENOUS
  Filled 2011-08-27: qty 1

## 2011-08-27 MED ORDER — HEPARIN SOD (PORK) LOCK FLUSH 100 UNIT/ML IV SOLN
500.0000 [IU] | Freq: Once | INTRAVENOUS | Status: AC | PRN
  Administered 2011-08-27: 500 [IU]
  Filled 2011-08-27: qty 5

## 2011-08-27 NOTE — Progress Notes (Signed)
Renee Serrano tolerated infusions well and without incident; verbalizes understanding for follow-up tomorrow for Neulasta.  No distress noted at time of discharge and patient was discharged home with a friend.

## 2011-08-28 ENCOUNTER — Encounter (HOSPITAL_BASED_OUTPATIENT_CLINIC_OR_DEPARTMENT_OTHER): Payer: BC Managed Care – PPO

## 2011-08-28 VITALS — BP 126/78 | HR 73

## 2011-08-28 DIAGNOSIS — C50419 Malignant neoplasm of upper-outer quadrant of unspecified female breast: Secondary | ICD-10-CM

## 2011-08-28 DIAGNOSIS — Z5189 Encounter for other specified aftercare: Secondary | ICD-10-CM

## 2011-08-28 MED ORDER — PEGFILGRASTIM INJECTION 6 MG/0.6ML
SUBCUTANEOUS | Status: AC
  Administered 2011-08-28: 6 mg via SUBCUTANEOUS
  Filled 2011-08-28: qty 0.6

## 2011-08-28 MED ORDER — PEGFILGRASTIM INJECTION 6 MG/0.6ML
6.0000 mg | Freq: Once | SUBCUTANEOUS | Status: AC
  Administered 2011-08-28: 6 mg via SUBCUTANEOUS

## 2011-08-28 NOTE — Progress Notes (Signed)
Tolerated neulasta well. 

## 2011-09-04 ENCOUNTER — Encounter (HOSPITAL_COMMUNITY): Payer: Self-pay | Admitting: Oncology

## 2011-09-04 ENCOUNTER — Encounter (HOSPITAL_BASED_OUTPATIENT_CLINIC_OR_DEPARTMENT_OTHER): Payer: BC Managed Care – PPO | Admitting: Oncology

## 2011-09-04 VITALS — BP 145/87 | HR 75 | Temp 98.8°F | Wt 214.2 lb

## 2011-09-04 DIAGNOSIS — J302 Other seasonal allergic rhinitis: Secondary | ICD-10-CM

## 2011-09-04 DIAGNOSIS — Z808 Family history of malignant neoplasm of other organs or systems: Secondary | ICD-10-CM

## 2011-09-04 DIAGNOSIS — J309 Allergic rhinitis, unspecified: Secondary | ICD-10-CM

## 2011-09-04 DIAGNOSIS — C50419 Malignant neoplasm of upper-outer quadrant of unspecified female breast: Secondary | ICD-10-CM

## 2011-09-04 DIAGNOSIS — H9209 Otalgia, unspecified ear: Secondary | ICD-10-CM

## 2011-09-04 MED ORDER — TRIAMCINOLONE ACETONIDE(NASAL) 55 MCG/ACT NA INHA: 2.0000 | Freq: Every day | NASAL | Status: DC

## 2011-09-04 MED ORDER — TRAMADOL HCL 50 MG PO TABS: ORAL_TABLET | ORAL | Status: DC

## 2011-09-04 MED ORDER — LORAZEPAM 1 MG PO TABS: 1.0000 mg | ORAL_TABLET | Freq: Four times a day (QID) | ORAL | Status: DC | PRN

## 2011-09-04 NOTE — Patient Instructions (Addendum)
Renee Serrano  [id number] April 28, 1956   [facility]  Discharge Instructions  RECOMMENDATIONS MADE BY THE CONSULTANT AND ANY TEST RESULTS WILL BE SENT TO YOUR REFERRING DOCTOR.   EXAM FINDINGS BY MD TODAY AND SIGNS AND SYMPTOMS TO REPORT TO CLINIC OR PRIMARY MD: Findings as discussed with T. Kefalas, PA-C.  Please feel free to contact us with any questions.  MEDICATIONS PRESCRIBED: Tramadol (new prescription), Ativan (refilled), Nasal spray (refill) Follow label directions on all medications.  INSTRUCTIONS GIVEN AND DISCUSSED: 1. Do not take the Vicodin, you may have a sensitivity to that medication causing the itching you reported. 2. Take Aleve x 2 tabs, three times daily for the leg pain.  You may also take the Tramadol if the pain is severe. 3. Resume chemo on 09/10/11 as scheduled, you will see Tom again 2 weeks after that to evaluate how faired with the leg pain.  I acknowledge that I have been informed and understand all the instructions given to me and received a copy. I do not have any more questions at this time, but understand that I may call the Specialty Clinic at [facility] at [phone] during business hours should I have any further questions or need assistance in obtaining follow-up care.    __________________________________________  _____________  __________ Signature of Patient or Authorized Representative            Date                   Time    __________________________________________ Nurse's Signature

## 2011-09-04 NOTE — Progress Notes (Signed)
No primary provider on file. No primary provider on file.  1. Seasonal allergies  triamcinolone (NASACORT AQ) 55 MCG/ACT nasal inhaler  2. Cancer of upper-outer quadrant of female breast  LORazepam (ATIVAN) 1 MG tablet, traMADol (ULTRAM) 50 MG tablet    CURRENT THERAPY:Status post 4 cycles of Cytoxan and Adriamycin and now S/P 2 cycle of Taxol. She is due for cycle 2 of Taxol (dose dense) tomorrow, 08/27/2011.   INTERVAL HISTORY: Renee Serrano 56 y.o. 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    the patient has 3 main complaints today. She reports that as she did last time that she experienced leg pain following her last chemotherapy administration. Per our last conversation, encourage the patient to take Aleve for this discomfort believing that it was Neulasta induced pain. She reports that the discomfort was aching, like a tooth ache encompassing both lower extremities bilaterally.  Although this discomfort was significant, it did not prevent the patient from performing her ADLs. She was not to bed bound during this time. She did take Aleve every 8 hours.  She reports that his discomfort began on 08/29/2011 the day following her Neulasta injection. It lasted for approximately 3 days. She reports she took that she took Aleve and on Sunday, 09/01/2011, she was awoken with the discomfort. She took a hydrocodone for the discomfort. She reports that this helped because she was able to fall back asleep. Unfortunately however, the patient had a pruritic sensation of her upper extremities, abdomen, chest, and neck. She does have a codeine allergy. This must be a cross reactivity of the hydrocodone and codeine. As result, I have asked her not to take any hydrocodone. She asked she did take Benadryl if this were to happen again and I advised her to do so. She puts some Lotrisone cream on the pruritic areas with no benefit.  The Lotrisone cream was given to her in the past for a inframammary fungal infection. Since this was not fungal related the in effectiveness of the Lotrisone is expected. I've offered the patient 2 choices in regards to this discomfort. We can treat this discomfort more aggressively which may not prove to be beneficial or we may switch her from Taxol to Taxotere chemotherapy. She reports that she would rather try to continue with this current chemotherapy instead of switching at this point time. Therefore I have suggested the following: 2 Aleve tablets 3 times a day, and tramadol 50 mg one to 2 tablets every 8 hours as needed for pain. This regimen can be utilized during her days of discomfort. The patient is very reliable and I trust that she will take these medications as prescribed. I've asked her to not take any more hydrocodone due to her pruritic side effect.  The patient also complains of left ear discomfort. She reports that sounds are "muscle" in the left ear. She has been having symptoms of seasonal allergies with clear nasal drainage. She has a strong history of this in the past. She reports that she has allergy flares every year around this time of year. She's asked me to inspect her ear which this was performed during physical exam section. She's also asked for a refill on her triamcinolone acetonide nasal spray. This was E. scribed to her pharmacy and she made administer 2 sprays to each nare daily.  The patient asks if we can manage and check her cholesterol. I've asked  her to followup with this with her primary care physician. She is agreed to do so.  She has an appointment with radiation oncology, namely Dr. Dayton Scrape, on 09/17/2011. I've encouraged her to keep this appointment. This appointment will be after cycle 3 of Taxol.  The patient reports that she needs a refill on her Ativan. She is utilizing and not only for nausea and vomiting which she is experiencing only minimally, but she is  also using it to help her sleep at night. She reports that this is very effective therapy And helps her sleep. This prescription was printed for her.  ROS: No TIA's or unusual headaches, no dysphagia.  No prolonged cough. No dyspnea or chest pain on exertion.  No abdominal pain, change in bowel habits, black or bloody stools.  No urinary tract symptoms.  No new or unusual musculoskeletal symptoms. No abnormal vaginal bleeding, discharge or unexpected pelvic pain. No new breast lumps, breast pain or nipple discharge.   Past Medical History  Diagnosis Date  . DVT (deep venous thrombosis)   . Sinus drainage   . Complication of anesthesia     paniced first surg-had to sedate prior to surg  . Anxiety   . DVT (deep venous thrombosis) 2000    does not know why-takes asa  . Allergy   . Asthma   . Cancer 2012    rt. breast ca dx-05/2011  . Breast cancer   . Sleep apnea     does use cpap    has Cancer of upper-outer quadrant of female breast on her problem list.     is allergic to penicillins and hydrocodone.  Renee Serrano does not currently have medications on file.  Past Surgical History  Procedure Date  . Functional endoscopic sinus surgery   . Abdominal hysterectomy   . Colonoscopy   . Portacath placement 06/17/2011    Procedure: INSERTION PORT-A-CATH;  Surgeon: Rulon Abide, DO;  Location: [facility];  Service: General;  Laterality: Left;  left side mediport placement    Denies any headaches, dizziness, double vision, fevers, chills, night sweats, nausea, vomiting, diarrhea, constipation, chest pain, heart palpitations, shortness of breath, blood in stool, black tarry stool, urinary pain, urinary burning, urinary frequency, hematuria.   PHYSICAL EXAMINATION  ECOG PERFORMANCE STATUS: 1 - Symptomatic but completely ambulatory  Filed Vitals:   09/04/11 1034  BP: 145/87  Pulse: 75  Temp: 98.8 F (37.1 C)    GENERAL:alert, no distress, well nourished, well  developed, comfortable, cooperative, smiling and alopecia SKIN: skin color, texture, turgor are normal, no rashes or significant lesions HEAD: Normocephalic, No masses, lesions, tenderness or abnormalities EYES: normal, EOMI, Conjunctiva are pink and non-injected EARS: External ears normal, Canals clear, TM's Normal OROPHARYNX:lips, buccal mucosa, and tongue normal and mucous membranes are moist  NECK: supple, no adenopathy, thyroid normal size, non-tender, without nodularity, no stridor, non-tender, trachea midline LYMPH:  no palpable lymphadenopathy BREAST:not examined LUNGS: clear to auscultation and percussion HEART: regular rate & rhythm, no murmurs, no gallops, S1 normal and S2 normal ABDOMEN:abdomen soft, non-tender and normal bowel sounds BACK: Back symmetric, no curvature., No CVA tenderness EXTREMITIES:less then 2 second capillary refill, no joint deformities, effusion, or inflammation, no edema, no skin discoloration, no clubbing, no cyanosis  NEURO: alert & oriented x 3 with fluent speech, no focal motor/sensory deficits, gait normal   LABORATORY DATA: CBC    Component Value Date/Time   WBC 12.5* 08/26/2011 1415   WBC 6.9 05/29/2011 1610  RBC 3.45* 08/26/2011 1415   RBC 4.34 05/29/2011 0822   HGB 11.0* 08/26/2011 1415   HGB 13.6 05/29/2011 0822   HCT 32.5* 08/26/2011 1415   HCT 40.3 05/29/2011 0822   PLT 174 08/26/2011 1415   PLT 285 05/29/2011 0822   MCV 94.2 08/26/2011 1415   MCV 92.9 05/29/2011 0822   MCH 31.9 08/26/2011 1415   MCH 31.3 05/29/2011 0822   MCHC 33.8 08/26/2011 1415   MCHC 33.7 05/29/2011 0822   RDW 15.4 08/26/2011 1415   RDW 12.6 05/29/2011 0822   LYMPHSABS 1.3 08/26/2011 1415   LYMPHSABS 2.0 05/29/2011 0822   MONOABS 0.7 08/26/2011 1415   MONOABS 0.5 05/29/2011 0822   EOSABS 0.4 08/26/2011 1415   EOSABS 0.5 05/29/2011 0822   BASOSABS 0.1 08/26/2011 1415   BASOSABS 0.0 05/29/2011 0822      Chemistry      Component Value Date/Time   NA 139  08/26/2011 1415   K 3.9 08/26/2011 1415   CL 102 08/26/2011 1415   CO2 28 08/26/2011 1415   BUN 14 08/26/2011 1415   CREATININE 0.74 08/26/2011 1415      Component Value Date/Time   CALCIUM 9.8 08/26/2011 1415   ALKPHOS 115 08/26/2011 1415   AST 54* 08/26/2011 1415   ALT 96* 08/26/2011 1415   BILITOT 0.2* 08/26/2011 1415         ASSESSMENT:  1. Right upper quadrant triple negative breast cancer that was clinically 5 x 7 cm though on MRI it was 4 cm. She also had CT with abnormal nodes in the axilla and along the chest wall where there is either lymph node or extension tumor to the pectorals major and also confirmed by the MRI of the breast.  2. LE pain/discomfort secondary to Taxol chemotherapy and/or Neulasta support. 3. History of asthma as a child with recurrence after third pregnancy.  4. Sleep apnea on a CPAP machine.  5. Mild obesity.  6. History of hysterectomy in 2004.  7. Deep venous thrombosis with a negative workup in 2000, treated with Coumadin for 6 months at that time.  8. History of hyperlipidemia.  9. Restless legs syndrome.  10. History of breast cancer in her family, namely her mother's mother had breast cancer. Her mother, herself, had colon cancer. Her mother's sister had colon cancer with recurrence x2. Another sister died of ovarian cancer in her 13s. There is a maternal aunt's with 2 children; one who had ductal carcinoma in situ and the other has had triple negative breast cancer in her late 78s  seen at [facility].  11.BRCA1 and BRCA2 negativity, but she may be seen by the geneticist for Lynch syndrome. [name] has called her and told her that she does not have BRCA1 or BRCA2. After this family history discussion, they want her tested for the Lynch syndrome and that blood has been sent.  12. Dry eyes, taking Replens  13. Hemangioma, unclear etiology     PLAN:  1. Patient has an appointment to see radiation oncology, Dr. Dayton Scrape, on 09/17/2011. 2. Discuss her  lower extremity discomfort. Patient education provide. Patient is not interested in switching from Taxol to Taxotere chemotherapy. We will continue with Taxol chemotherapy. Will treat her lower extremity the discomfort more aggressively. She may take 2 Aleve tablets 3 times daily. A prescription was also provided to her up tramadol 50 mg. She may take 1-2 tablets every 8 hours as needed for pain. #30 with no refills. 3. Prescription  for Ativan #30 with 3 refills 1 mg tablets. She may take 1 tablet every 6 hours as needed for nausea vomiting and at hour of sleep for insomnia. 4. Refilled the patient's triamcinolone acetonide nasal spray with 0 refills. She may perform 2 sprays to each nare daily. 5. Encourage the patient to continue with her 0 tach allergy medication. 6. I've asked the patient followup with her cholesterol with her primary care physician. 7. Discontinue hydrocodone 8. Continue with chemotherapy as scheduled. We'll continue with Taxol chemotherapy. 9. Prechemotherapy laboratory work as ordered as standing orders. 10. Patient will return in 2 weeks time to followup on her previous chemotherapy therapy menstruation. We may have to consider morphine tablets during her 3-4 days of discomfort if the above mentioned pain management is not effective. Patient is wary of increased narcotic but we may need to consider this in the future for her final cycle of chemotherapy.   All questions were answered. The patient knows to call the clinic with any problems, questions or concerns. We can certainly see the patient much sooner if necessary.  The patient and plan discussed with Glenford Peers, MD and he is in agreement with the aforementioned.   Alyssamae Klinck

## 2011-09-05 ENCOUNTER — Encounter (INDEPENDENT_AMBULATORY_CARE_PROVIDER_SITE_OTHER): Payer: Self-pay | Admitting: General Surgery

## 2011-09-05 ENCOUNTER — Ambulatory Visit (INDEPENDENT_AMBULATORY_CARE_PROVIDER_SITE_OTHER): Payer: BC Managed Care – PPO | Admitting: General Surgery

## 2011-09-05 VITALS — BP 138/86 | HR 76 | Temp 97.8°F | Ht 66.0 in | Wt 212.6 lb

## 2011-09-05 DIAGNOSIS — C50911 Malignant neoplasm of unspecified site of right female breast: Secondary | ICD-10-CM

## 2011-09-05 DIAGNOSIS — C50919 Malignant neoplasm of unspecified site of unspecified female breast: Secondary | ICD-10-CM

## 2011-09-05 MED ORDER — DEXTROSE 5 % IV SOLN: 600.0000 mg | Freq: Once | INTRAVENOUS | Status: DC

## 2011-09-05 NOTE — Progress Notes (Signed)
Patient ID: Renee Serrano, female   DOB: 1955-12-27, 56 y.o.   MRN: [id number]  No chief complaint on file.   HPI Renee Serrano is a 56 y.o. female.  Patient known to me for prior right breast cancer, triple negative, and she has been receiving neoadjuvant chemotherapy and is scheduled to receive her last treatment in mid April. She has had a great response and has no clinically evident tumor remaining. She is tolerating chemotherapy well. She is here to discuss surgical management. BRCA negative but has a significant family history. HPI  Past Medical History  Diagnosis Date  . DVT (deep venous thrombosis)   . Sinus drainage   . Complication of anesthesia     paniced first surg-had to sedate prior to surg  . Anxiety   . DVT (deep venous thrombosis) 2000    does not know why-takes asa  . Allergy   . Asthma   . Cancer 2012    rt. breast ca dx-05/2011  . Breast cancer   . Sleep apnea     does use cpap    Past Surgical History  Procedure Date  . Functional endoscopic sinus surgery   . Abdominal hysterectomy   . Colonoscopy   . Portacath placement 06/17/2011    Procedure: INSERTION PORT-A-CATH;  Surgeon: Rulon Abide, DO;  Location: [facility];  Service: General;  Laterality: Left;  left side mediport placement    Family History  Problem Relation Age of Onset  . Cancer Mother     colon  . Cancer Maternal Grandmother     breast   . Cancer Cousin     breast  . Cancer Cousin     breast  . Cancer Maternal Aunt     colon  . Cancer Maternal Aunt     breast     Social History History  Substance Use Topics  . Smoking status: Never Smoker   . Smokeless tobacco: Not on file  . Alcohol Use: No    Allergies  Allergen Reactions  . Penicillins Hives  . Hydrocodone Itching    Noted 3/13    Current Outpatient Prescriptions  Medication Sig Dispense Refill  . Ascorbic Acid (VITAMIN C WITH ROSE HIPS) 500 MG tablet Take 500 mg by mouth daily.        Marland Kitchen  aspirin 81 MG tablet Take 81 mg by mouth daily.        . citalopram (CELEXA) 40 MG tablet Take 40 mg by mouth daily.        . clotrimazole-betamethasone (LOTRISONE) cream Apply topically 2 (two) times daily. Apply bid for 10-14 days to rash  30 g  0  . COMPRO 25 MG suppository       . dexamethasone (DECADRON) 4 MG tablet Take 2 tabs at 9pm the night before chemo and 2 tabs at 3am the morning of chemo. Then starting the day after chemo take 2 tablets in the am and 2 tablets in the pm for 2 days. Take with food.      . docusate sodium (COLACE) 50 MG capsule Take by mouth daily. Taking 8 total capsules daily at present      . lidocaine-prilocaine (EMLA) cream       . LORazepam (ATIVAN) 1 MG tablet Take 1 tablet (1 mg total) by mouth every 6 (six) hours as needed for anxiety.  30 tablet  3  . montelukast (SINGULAIR) 10 MG tablet Take 10 mg by mouth at bedtime.        Marland Kitchen  Multiple Vitamin (MULTIVITAMIN) tablet Take 1 tablet by mouth daily.        . ondansetron (ZOFRAN) 8 MG tablet Take 8 mg by mouth 2 (two) times daily. Starting the day after chemo, take 1 tablet in the am and 1 tablet in the pm for 2 days. Then may take 1 tab twice a day as needed for nausea/vomiting.      Marland Kitchen PACLitaxel (TAXOL) 300 MG/50ML injection Inject into the vein. q14 days x 4 cycles ( to start August 13 2011)      . Pegfilgrastim (NEULASTA [place]) Inject into the skin. Every 14 days after chemo      . rOPINIRole (REQUIP) 0.5 MG tablet Take 1 mg by mouth at bedtime.        . simvastatin (ZOCOR) 40 MG tablet Take 40 mg by mouth at bedtime.        . traMADol (ULTRAM) 50 MG tablet Take 1 - 2 tablets, PO every 8 hours as needed for pain  30 tablet  0  . triamcinolone (NASACORT AQ) 55 MCG/ACT nasal inhaler Place 2 sprays into the nose daily.  1 Inhaler  0   Current Facility-Administered Medications  Medication Dose Route Frequency Provider Last Rate Last Dose  . clindamycin (CLEOCIN) 600 mg in dextrose 5 % 50 mL IVPB  600 mg Intravenous  Once Lodema Pilot, DO        Review of Systems Review of Systems  Blood pressure 138/86, pulse 76, temperature 97.8 F (36.6 C), height 5\' 6"  (1.676 m), weight 212 lb 9.6 oz (96.435 kg).  Physical Exam Physical Exam Her right breast is normal appearance without any significant masses. The previous mass which was readily palpable it is no longer evident and I can feel no dominant masses, lymphadenopathy, or skin changes. Data Reviewed   Assessment    Right breast cancer, status post neoadjuvant treatment. She has had an excellent response to hernial adjuvant chemotherapy and is here to discuss surgical options. We again discussed the options of breast conservation therapy versus mastectomy and she remains resolution in her decision to pursue mastectomy for treatment. I did explain that there is no particular survival benefit with mastectomy over breast conservation therapy and she expressed understanding of this but she is still interested in mastectomy. She had a biopsy of her lymph nodes prior to her chemotherapy which was negative and I do not appreciate any nodal disease on exam today. I also have recommended sentinel lymph node biopsy. I do not feel that we need to proceed with axillary dissection unless her lymph nodes are positive since we have no indication that she has any axillary disease. She is interested in reconstruction, however, she tells me that in her radiation oncologist has recommended radiation regardless of her treatment which would preclude immediate reconstruction. I recommended that she discuss this again with Dr. Dayton Scrape and if he is going to give radiation regardless, then we will hold off on reconstruction option.s and plan for delayed reconstruction. We also discussed the options for preoperative MRI to evaluate her response, however, she is fixed in her decision for mastectomy and so not certain that MRI is really going to provide much benefit other than to visualize  tumor response. It is not likely going to change our surgical management at this time. I will discuss her case with Dr. Mariel Sleet as well to see if he feels strongly about the need for MRI.   Plan    Consider MRI Will plan for  right mastectomy/SLN bx and possible axillary dissection.We discussed the risks of the procedure including infection, bleeding, pain, scarring, recurrence, need for repeat surgery, nerve injury, lymphedema and need for completion axillary dissection, poor cosmesis and need for reconstruction and she expressed understanding and desires to proceed with right mastectomy and sentinel lymph node biopsy        Jenefer Woerner DAVID 09/05/2011, 1:51 PM

## 2011-09-09 ENCOUNTER — Encounter (HOSPITAL_COMMUNITY): Payer: BC Managed Care – PPO | Attending: Oncology

## 2011-09-09 DIAGNOSIS — C50419 Malignant neoplasm of upper-outer quadrant of unspecified female breast: Secondary | ICD-10-CM | POA: Insufficient documentation

## 2011-09-09 LAB — COMPREHENSIVE METABOLIC PANEL
Alkaline Phosphatase: 114 U/L (ref 39–117)
BUN: 15 mg/dL (ref 6–23)
Chloride: 103 mEq/L (ref 96–112)
Creatinine, Ser: 0.71 mg/dL (ref 0.50–1.10)
GFR calc Af Amer: >90 mL/min (ref >90)
GFR calc non Af Amer: >90 mL/min (ref >90)
Glucose, Bld: 88 mg/dL (ref 70–99)
Potassium: 4.1 mEq/L (ref 3.5–5.1)
Sodium: 141 mEq/L (ref 135–145)
Total Bilirubin: 0.3 mg/dL (ref 0.3–1.2)
Total Protein: 6.8 g/dL (ref 6.0–8.3)

## 2011-09-09 LAB — CANCER ANTIGEN 27.29: CA 27.29: 46 U/mL — ABNORMAL HIGH (ref 0–39)

## 2011-09-09 LAB — DIFFERENTIAL
Eosinophils Absolute: 0.8 10*3/uL — ABNORMAL HIGH (ref 0.0–0.7)
Lymphocytes Relative: 13 % (ref 12–46)
Lymphs Abs: 1.2 10*3/uL (ref 0.7–4.0)
Monocytes Absolute: 0.4 10*3/uL (ref 0.1–1.0)
Monocytes Relative: 4 % (ref 3–12)
Neutro Abs: 6.8 10*3/uL (ref 1.7–7.7)
Neutrophils Relative %: 73 % (ref 43–77)

## 2011-09-09 LAB — CBC
HCT: 31.6 % — ABNORMAL LOW (ref 36.0–46.0)
Hemoglobin: 10.4 g/dL — ABNORMAL LOW (ref 12.0–15.0)
MCH: 31.8 pg (ref 26.0–34.0)
MCHC: 32.9 g/dL (ref 30.0–36.0)
RBC: 3.27 MIL/uL — ABNORMAL LOW (ref 3.87–5.11)
WBC: 9.3 10*3/uL (ref 4.0–10.5)

## 2011-09-10 ENCOUNTER — Encounter (HOSPITAL_BASED_OUTPATIENT_CLINIC_OR_DEPARTMENT_OTHER): Payer: BC Managed Care – PPO

## 2011-09-10 VITALS — BP 150/85 | HR 83 | Temp 98.3°F | Wt 214.0 lb

## 2011-09-10 DIAGNOSIS — C50419 Malignant neoplasm of upper-outer quadrant of unspecified female breast: Secondary | ICD-10-CM

## 2011-09-10 DIAGNOSIS — Z5111 Encounter for antineoplastic chemotherapy: Secondary | ICD-10-CM

## 2011-09-10 MED ORDER — DIPHENHYDRAMINE HCL 50 MG/ML IJ SOLN
INTRAMUSCULAR | Status: AC
  Filled 2011-09-10: qty 1

## 2011-09-10 MED ORDER — SODIUM CHLORIDE 0.9 % IV SOLN
Freq: Once | INTRAVENOUS | Status: AC
  Administered 2011-09-10: 500 mL via INTRAVENOUS

## 2011-09-10 MED ORDER — SODIUM CHLORIDE 0.9 % IV SOLN: 8.0000 mg | Freq: Once | INTRAVENOUS | Status: DC

## 2011-09-10 MED ORDER — SODIUM CHLORIDE 0.9 % IJ SOLN
10.0000 mL | INTRAMUSCULAR | Status: DC | PRN
  Administered 2011-09-10: 10 mL
  Filled 2011-09-10: qty 10

## 2011-09-10 MED ORDER — DIPHENHYDRAMINE HCL 50 MG/ML IJ SOLN
50.0000 mg | Freq: Once | INTRAMUSCULAR | Status: AC
  Administered 2011-09-10: 50 mg via INTRAVENOUS

## 2011-09-10 MED ORDER — SODIUM CHLORIDE 0.9 % IJ SOLN
INTRAMUSCULAR | Status: AC
  Filled 2011-09-10: qty 10

## 2011-09-10 MED ORDER — DEXAMETHASONE SODIUM PHOSPHATE 4 MG/ML IJ SOLN
20.0000 mg | Freq: Once | INTRAMUSCULAR | Status: DC
  Filled 2011-09-10: qty 5

## 2011-09-10 MED ORDER — ONDANSETRON HCL 4 MG/2ML IJ SOLN
Freq: Once | INTRAMUSCULAR | Status: AC
  Administered 2011-09-10: 8 mg via INTRAVENOUS
  Filled 2011-09-10: qty 4

## 2011-09-10 MED ORDER — FAMOTIDINE IN NACL 20-0.9 MG/50ML-% IV SOLN
20.0000 mg | Freq: Once | INTRAVENOUS | Status: AC
  Administered 2011-09-10: 20 mg via INTRAVENOUS

## 2011-09-10 MED ORDER — FAMOTIDINE IN NACL 20-0.9 MG/50ML-% IV SOLN
INTRAVENOUS | Status: AC
  Filled 2011-09-10: qty 50

## 2011-09-10 MED ORDER — HEPARIN SOD (PORK) LOCK FLUSH 100 UNIT/ML IV SOLN
500.0000 [IU] | Freq: Once | INTRAVENOUS | Status: AC | PRN
  Administered 2011-09-10: 500 [IU]
  Filled 2011-09-10: qty 5

## 2011-09-10 MED ORDER — PACLITAXEL CHEMO INJECTION 300 MG/50ML
175.0000 mg/m2 | Freq: Once | INTRAVENOUS | Status: AC
  Administered 2011-09-10: 378 mg via INTRAVENOUS
  Filled 2011-09-10: qty 63

## 2011-09-10 MED ORDER — HEPARIN SOD (PORK) LOCK FLUSH 100 UNIT/ML IV SOLN
INTRAVENOUS | Status: AC
  Filled 2011-09-10: qty 5

## 2011-09-10 NOTE — Progress Notes (Signed)
Renee Serrano tolerated infusions well and without incident; verbalizes understanding for follow-up.  No distress noted at time of discharge and patient was discharged home with two friends that came with her.

## 2011-09-11 ENCOUNTER — Encounter (HOSPITAL_BASED_OUTPATIENT_CLINIC_OR_DEPARTMENT_OTHER): Payer: BC Managed Care – PPO

## 2011-09-11 DIAGNOSIS — C50419 Malignant neoplasm of upper-outer quadrant of unspecified female breast: Secondary | ICD-10-CM

## 2011-09-11 DIAGNOSIS — Z5189 Encounter for other specified aftercare: Secondary | ICD-10-CM

## 2011-09-11 MED ORDER — PEGFILGRASTIM INJECTION 6 MG/0.6ML
6.0000 mg | Freq: Once | SUBCUTANEOUS | Status: AC
  Administered 2011-09-11: 6 mg via SUBCUTANEOUS

## 2011-09-11 MED ORDER — PEGFILGRASTIM INJECTION 6 MG/0.6ML
SUBCUTANEOUS | Status: AC
  Filled 2011-09-11: qty 0.6

## 2011-09-11 NOTE — Progress Notes (Signed)
Renee Serrano presents today for injection per the provider's orders.  Neulasta administered administration without incident; see MAR for injection details.  Patient tolerated procedure well and without incident.  No questions or complaints noted at this time.

## 2011-09-13 ENCOUNTER — Encounter: Payer: Self-pay | Admitting: Radiation Oncology

## 2011-09-13 NOTE — Progress Notes (Signed)
56 year old female.  Seen in the [facility] on 05/29/11 by Dr. Dayton Scrape. 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 Now status post 4 cycles of dose dense AC with a very nice response and no distinctly palpable tumor in the upper outer quadrant. Plan is to proceed with right breast mastectomy and sentinel lymph node biopsy.   Considering radiation therapy in [place].    [id number]       Hydrocodone-itching No history of radiation therapy No indication of a pacemaker

## 2011-09-17 ENCOUNTER — Ambulatory Visit
Admission: RE | Admit: 2011-09-17 | Discharge: 2011-09-17 | Disposition: A | Discharge status: Home / Self Care | Payer: BC Managed Care – PPO | Source: Ambulatory Visit | Attending: Radiation Oncology | Admitting: Radiation Oncology

## 2011-09-17 ENCOUNTER — Encounter: Payer: Self-pay | Admitting: Radiation Oncology

## 2011-09-17 VITALS — BP 147/87 | HR 74 | Temp 98.3°F | Resp 20 | Ht 66.0 in | Wt 211.6 lb

## 2011-09-17 DIAGNOSIS — C50419 Malignant neoplasm of upper-outer quadrant of unspecified female breast: Secondary | ICD-10-CM

## 2011-09-17 DIAGNOSIS — Z79899 Other long term (current) drug therapy: Secondary | ICD-10-CM | POA: Insufficient documentation

## 2011-09-17 DIAGNOSIS — C50919 Malignant neoplasm of unspecified site of unspecified female breast: Secondary | ICD-10-CM | POA: Insufficient documentation

## 2011-09-17 DIAGNOSIS — Z9010 Acquired absence of unspecified breast and nipple: Secondary | ICD-10-CM | POA: Insufficient documentation

## 2011-09-17 NOTE — Progress Notes (Signed)
Please see the Nurse Progress Note in the MD Initial Consult Encounter for this patient. 

## 2011-09-17 NOTE — Progress Notes (Signed)
Followup note:  Diagnosis: Stage IIA (T2, N0, M0) versus stage III a (T3, N1, M0) invasive ductal carcinoma of the right breast.  Ms. Renee Serrano returns today for review, having almost completed her neoadjuvant chemotherapy. I first saw the patient in consultation on 05/29/2011. To review, she presented with a 3 cm mass palpated by her gynecologist who then obtained a mammogram in [place] on 04/09/2011. A biopsy from 05/07/2011 was diagnostic for invasive of the carcinoma which was triple negative. She was evaluated further here in [place] and had a MRI scan showing a 4 cm mass within the central/slightly outer right breast along with abnormal linear enhancement extending anteriorly and posteriorly from the mass (suspicious for DCIS) and a probable satellite nodule abutting and may involve the right pectoralis musculature. The area of abnormal enhancement spanned 10 cm in maximal AP diameter. The satellite nodule demonstrated washout kinetics highly suspicious for carcinoma. The right axillary lymph nodes were increased in number and #2 markedly abnormal right axilla lymph nodes were taken cortices and ear loss of the fatty hila. The largest lymph node measured 1.5 cm. These nodes were felt to be pathologic, however a right axillary node biopsy which did contain lymphoid tissue was without evidence for metastatic carcinoma. She has been receiving dose dense AC chemotherapy with an excellent clinical response. She has one more cycle. She has not yet been restaged. I should mention that her pre-chemotherapy CT scan of the chest from 06/07/2011 showed a 3.0 cm right breast mass and also suspicious adenopathy. She was also felt to have benign subpleural opacity/pulmonary nodules bilaterally measuring up to 0.5 cm. Close followup was suggested. Her bone scan was without evidence for metastatic disease. She visited Dr. Biagio Quint 2 weeks ago and she indicated that she wants to have a mastectomy. Dr. Biagio Quint offered her a  sentinel lymph node biopsy. She is without complaints today. Her chemotherapy has been well tolerated.  Physical examination: Alert and oriented. Wt Readings from Last 3 Encounters:  09/17/11 211 lb 9.6 oz (95.981 kg)  09/10/11 214 lb (97.07 kg)  09/05/11 212 lb 9.6 oz (96.435 kg)   Temp Readings from Last 3 Encounters:  09/17/11 98.3 F (36.8 C) Oral  09/10/11 98.3 F (36.8 C) Oral  09/05/11 97.8 F (36.6 C)    BP Readings from Last 3 Encounters:  09/17/11 147/87  09/10/11 150/85  09/05/11 138/86   Pulse Readings from Last 3 Encounters:  09/17/11 74  09/10/11 83  09/05/11 76   Head and neck examination remarkable for alopecia. Nodes without palpable cervical, supraclavicular, or axillary lymphadenopathy. Chest: Lungs clear. Left anterior Port-A-Cath. Heart: Regular in rhythm. Back without spinal or CVA tenderness. Breasts: There is a 1-1.5 cm mass along the upper-outer quadrant of the right breast at approximately 10:00 which may represent persistent disease. Left breast without masses or lesions. Abdomen without hepatomegaly. Extremities without edema.  Impression: Clinical stage IIA (T2, N0, M0) versus clinical stage IIIA (T3 N1 M0) invasive ductal carcinoma the right breast. On careful review of her presenting MRI scan and CT scans I am not convinced that one or more of the axilla lymph nodes are benign (false-negative biopsy). There are recent data to suggest that a sentinel lymph node can be useful following neoadjuvant chemotherapy with perhaps as high and accuracy rate of 85% depending on the number of lymph nodes removed. On the other hand, I would lean towards a completion axillary node dissection in this lady with triple negative cancer with careful and complete  dissection of the pectoral region which may contain a satellite nodule or adenopathy. Based on her clinical stage she would probably be considered for right chest wall and regional node radiation. A decision regarding  radiation therapy can be made based on her final pathologic stage and other risk factors. I told her that she can receive radiation therapy in [place], however she prefers to have a possible radiation therapy here in [place]. I assume that she'll be represented at the Wednesday morning multidisciplinary conference in the near future (following completion of chemotherapy). Although it would be interesting to repeat her MRI scan to assess her response to therapy, I do not think that this would be necessary with her scheduled for a mastectomy.  30 minutes was spent face-to-face with the patient, primarily counseling the patient and reviewing her studies.

## 2011-09-17 NOTE — Progress Notes (Signed)
Breast clinic pt. Scheduled for right mastectomy, SN bx on 10/16/11, Dr Biagio Quint.  Pt c/o discomfort in legs, possibly due to chemo or Neulasta, per Dr Mariel Sleet. Tramadol prn. Pt also has numbness of fingers, toes.

## 2011-09-18 ENCOUNTER — Encounter (HOSPITAL_BASED_OUTPATIENT_CLINIC_OR_DEPARTMENT_OTHER): Payer: BC Managed Care – PPO | Admitting: Oncology

## 2011-09-18 ENCOUNTER — Encounter (HOSPITAL_COMMUNITY): Payer: Self-pay | Admitting: Oncology

## 2011-09-18 VITALS — BP 141/91 | HR 76 | Temp 98.3°F | Wt 210.8 lb

## 2011-09-18 DIAGNOSIS — C50419 Malignant neoplasm of upper-outer quadrant of unspecified female breast: Secondary | ICD-10-CM

## 2011-09-18 DIAGNOSIS — Z86718 Personal history of other venous thrombosis and embolism: Secondary | ICD-10-CM

## 2011-09-18 NOTE — Patient Instructions (Signed)
Renee Serrano  [id number] 1955-10-04   [facility]  Discharge Instructions  RECOMMENDATIONS MADE BY THE CONSULTANT AND ANY TEST RESULTS WILL BE SENT TO YOUR REFERRING DOCTOR.   EXAM FINDINGS BY MD TODAY AND SIGNS AND SYMPTOMS TO REPORT TO CLINIC OR PRIMARY MD: Doing well.  Pre-chemo labs on Monday at your Primary Care and have them fax the results to [place].  MEDICATIONS PRESCRIBED: none   INSTRUCTIONS GIVEN AND DISCUSSED: Other :  Report any new lumps, bone pain or shortness of breath.  SPECIAL INSTRUCTIONS/FOLLOW-UP: Return to Clinic in 3 weeks to see PA.   I acknowledge that I have been informed and understand all the instructions given to me and received a copy. I do not have any more questions at this time, but understand that I may call the Specialty Clinic at [facility] at [phone] during business hours should I have any further questions or need assistance in obtaining follow-up care.    __________________________________________  _____________  __________ Signature of Patient or Authorized Representative            Date                   Time    __________________________________________ Nurse's Signature

## 2011-09-18 NOTE — Progress Notes (Signed)
Zachery Dauer, MD, MD [address] [place] [address]  1. Cancer of upper-outer quadrant of female breast     CURRENT THERAPY:Status post 4 cycles of Cytoxan and Adriamycin and now S/P 3 cycle of Taxol. She is due for cycle 4 of Taxol (dose dense) on 09/24/2011.  She will then pursue surgical intervention followed by radiation.   INTERVAL HISTORY: TENLEY WINWARD 56 y.o. 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   She recently saw Dr. Dayton Scrape (radiation oncology) in consultation for consideration of radiation.  They had a very nice discussion according to the patient.  However, today, she was moderately emotional because she is absorbing conflicting information from oncology and radiation oncology, compared to her surgeon.  Oncology and radiation oncology have the opinion that a lymph node dissection would be more beneficial than a sentinel node biopsy in this patient's case due to initial CT and MRi of breast which was worrisome for potential involvement of the pectoralis musculature of her disease.  I spent some time with the patient discussing the differences of a sentinel biopsy versus a lymph node dissection, and the procedures itself in very generalized terms.  I have encouraged her to ask her surgeon more questions regarding the procedure and the details.  She was grateful this conversation/discussion.  She is please with our interaction today.  The patient reports that her post-chemotherapy pain was much improved with the pain regimen described in my previous note (09/04/2011).  She utilized Aleve and Tramadol.  She is excited to complete her final cycle of chemotherapy next Tuesday.  ROS: No TIA's or unusual headaches, no dysphagia.  No prolonged cough. No dyspnea or chest pain on exertion.  No abdominal pain, change in bowel habits, black or bloody stools.  No urinary tract symptoms.  No  new or unusual musculoskeletal symptoms.  No new breast lumps, breast pain or nipple discharge.  Past Medical History  Diagnosis Date  . DVT (deep venous thrombosis)   . Sinus drainage   . Complication of anesthesia     paniced first surg-had to sedate prior to surg  . Anxiety   . DVT (deep venous thrombosis) 2000    does not know why-takes asa  . Allergy   . Asthma   . Sleep apnea     does use cpap  . Breast cancer 05/2011    right breast ca  . Hyperlipidemia   . Restless leg syndrome     has Cancer of upper-outer quadrant of female breast on her problem list.     is allergic to penicillins and hydrocodone.  Ms. Leason does not currently have medications on file.  Past Surgical History  Procedure Date  . Functional endoscopic sinus surgery   . Colonoscopy   . Portacath placement 06/17/2011    Procedure: INSERTION PORT-A-CATH;  Surgeon: Rulon Abide, DO;  Location: [facility];  Service: General;  Laterality: Left;  left side mediport placement  . Breast biopsy 05/07/2011    right, ER/PR -, HER 2 -  . Abdominal hysterectomy 2004    Denies any headaches, dizziness, double vision, fevers, chills, night sweats, nausea, vomiting, diarrhea, constipation, chest pain, heart palpitations, shortness of breath, blood in stool, black tarry stool, urinary pain, urinary burning, urinary frequency, hematuria.   PHYSICAL EXAMINATION  ECOG PERFORMANCE STATUS: 1 - Symptomatic but completely ambulatory  Filed Vitals:  09/18/11 1114  BP: 141/91  Pulse: 76  Temp: 98.3 F (36.8 C)    GENERAL:alert, no distress, well nourished, well developed, comfortable, cooperative, crying, obese and smiling SKIN: skin color, texture, turgor are normal, no rashes or significant lesions HEAD: Normocephalic, No masses, lesions, tenderness or abnormalities EYES: normal, Conjunctiva are pink and non-injected EARS: External ears normal OROPHARYNX:lips, buccal mucosa, and tongue  normal and mucous membranes are moist  NECK: supple, trachea midline LYMPH:  not examined BREAST:not examined LUNGS: clear to auscultation  HEART: regular rate & rhythm, no murmurs, no gallops, S1 normal and S2 normal ABDOMEN:abdomen soft, non-tender, obese and normal bowel sounds BACK: Back symmetric, no curvature., No CVA tenderness EXTREMITIES:less then 2 second capillary refill, no joint deformities, effusion, or inflammation, no skin discoloration, no clubbing, no cyanosis, positive findings:  edema 1+ non-pitting LE edema B/L  NEURO: alert & oriented x 3 with fluent speech, no focal motor/sensory deficits, gait normal   LABORATORY DATA: CBC    Component Value Date/Time   WBC 9.3 09/09/2011 1021   WBC 6.9 05/29/2011 0822   RBC 3.27* 09/09/2011 1021   RBC 4.34 05/29/2011 0822   HGB 10.4* 09/09/2011 1021   HGB 13.6 05/29/2011 0822   HCT 31.6* 09/09/2011 1021   HCT 40.3 05/29/2011 0822   PLT 174 09/09/2011 1021   PLT 285 05/29/2011 0822   MCV 96.6 09/09/2011 1021   MCV 92.9 05/29/2011 0822   MCH 31.8 09/09/2011 1021   MCH 31.3 05/29/2011 0822   MCHC 32.9 09/09/2011 1021   MCHC 33.7 05/29/2011 0822   RDW 16.0* 09/09/2011 1021   RDW 12.6 05/29/2011 0822   LYMPHSABS 1.2 09/09/2011 1021   LYMPHSABS 2.0 05/29/2011 0822   MONOABS 0.4 09/09/2011 1021   MONOABS 0.5 05/29/2011 0822   EOSABS 0.8* 09/09/2011 1021   EOSABS 0.5 05/29/2011 0822   BASOSABS 0.0 09/09/2011 1021   BASOSABS 0.0 05/29/2011 0822      Chemistry      Component Value Date/Time   NA 141 09/09/2011 1021   K 4.1 09/09/2011 1021   CL 103 09/09/2011 1021   CO2 28 09/09/2011 1021   BUN 15 09/09/2011 1021   CREATININE 0.71 09/09/2011 1021      Component Value Date/Time   CALCIUM 9.9 09/09/2011 1021   ALKPHOS 114 09/09/2011 1021   AST 59* 09/09/2011 1021   ALT 94* 09/09/2011 1021   BILITOT 0.3 09/09/2011 1021       RADIOGRAPHIC STUDIES: 06/07/2011  *RADIOLOGY REPORT*  Clinical Data: Staging newly diagnosed right breast cancer  CT CHEST WITH  CONTRAST  Technique: Multidetector CT imaging of the chest was performed  following the standard protocol during bolus administration of  intravenous contrast.  Contrast: 80mL OMNIPAQUE IOHEXOL 300 MG/ML IV SOLN  Comparison: None.  Findings: 3.0 x 3.0 cm mass in the central right breast (series  2/image 31), likely reflecting the patient's known primary breast  malignancy. Mild stranding/irregularity of the underlying  pectoralis muscle medially (series 2/image 30), with possible  abnormal soft tissue just anterior to the pectoralis muscle  inferiorly (series 2/image 33).  1.5 cm short-axis enlarged right axillary lymph node (series  2/image 18), suspicious. Additional 1.2 cm short-axis right  axillary/chest wall lymph node (series 2/image 23), suspicious.  Tiny nodules / subpleural opacities in the bilateral lungs,  including:  --2 mm subpleural opacity in the lateral right upper lobe (series  5/image 16)  --4 mm nodule in the left upper lobe (series 5/image 20)  --  5 mm subpleural opacity in the posterior left lower lobe (series  5/image 28)  --3 mm nodule along the left fissure (series 5/image 29)  Visualized thyroid is unremarkable.  The heart is normal in size. No pericardial effusion.  No suspicious mediastinal or hilar lymphadenopathy.  Visualized upper abdomen is notable for a tiny hiatal hernia.  Mild degenerative changes of the visualized thoracolumbar spine.  IMPRESSION:  3.0 cm mass in the central right breast, likely reflecting the  patient's known primary breast malignancy. Findings worrisome for  possible involvement of the underlying pectoralis muscle, as  described above.  1.5 cm short-axis enlarged right axillary lymph node, suspicious.  1.2 cm short-axis right axillary/chest wall lymph node, suspicious.  Tiny subpleural opacities / pulmonary nodules bilaterally,  measuring up to 5 mm. The appearance is nonspecific and  statistically likely benign. However, given  known malignancy,  close attention on follow-up is suggested.  Original Report Authenticated By: Charline Bills, M.D.    06/06/2011  *RADIOLOGY REPORT*  Clinical Data: Newly diagnosed right breast cancer. Ultrasound  guided core needle biopsy of a right breast mass in the 9:30  position of the right breast was performed in [place], [place] on  05/07/2011.  BILATERAL BREAST MRI WITH AND WITHOUT CONTRAST  Technique: Multiplanar, multisequence MR images of both breasts  were obtained prior to and following the intravenous administration  of 20ml of Multihance. Three dimensional images were evaluated at  the independent DynaCad workstation.  Comparison: Bilateral screening mammogram 03/26/2011 from [facility] [place], [place]. Diagnostic right mammogram and  right breast ultrasound 04/09/2011 right breast ultrasound from  [facility] [place], [place].  Findings: There is an irregular heterogeneously enhancing mass in  the posterior third of the central and slightly outer right breast  near the level of the nipple, with some central areas of necrosis.  The mass measures approximately 3.7 x 4.0 x 3.2 cm (AP by  transverse by craniocaudal dimensions). Enhancement curves of the  mass include washout and plateau kinetics.  Extending anteriorly from the lateral aspect of the mass is  abnormal linearly oriented enhancement, highly suspicious for  associated ductal carcinoma in situ. 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  There is a 1.5 x 0.7 x 1.1 cm heterogeneously enhancing slightly  irregular nodule directly posterior to the dominant mass that abuts  the inferior aspect of the right pectoralis minor muscle. This  nodule demonstrates washout kinetics and is highly suspicious for a  satellite nodule. On sagittal images, it is  difficult to separate  this probable malignant nodule from the pectoralis muscle, and  involvement of the musculature by tumor cannot be excluded.  In total, the abnormal areas of enhancement in the right breast  measure 10 cm anterior to posterior dimension.  There are no findings to suggest malignancy in the left breast.  Right axillary lymph nodes are increased in number. Two markedly  abnormal right axillary lymph nodes are seen, with thickened  cortices and near loss of the fatty hila. The largest lymph node  measures 1.5 cm short axis. There is an a an asymmetric slightly  suspicious subpectoral lymph node on the right that measures 5 mm  short axis. No suspicious left axillary lymph nodes. Negative for  internal mammary chain lymphadenopathy.  A small amount of pleural fluid is seen on the right. While this  amount can be  within normal limits, it is asymmetric compared to  the left side.  IMPRESSION:  1. 4 cm irregular heterogeneously enhancing mass in the central  slightly outer right breast corresponds to biopsy-proven  malignancy. The extent of malignancy is likely greater than  expected by recent mammogram and ultrasound. Abnormal linear  enhancement extend anteriorly and posteriorly from the mass  (suspicious for DCIS) and a probable satellite nodule abuts and may  involve the right pectoralis musculature. Abnormal enhancement  spans 10 cm in maximal AP diameter.  If thought to be useful for treatment planning purposes, a second  biopsy could be performed of the linear enhancement extending  anteriorly from the mass under MRI guidance.  2. Pathologic right axillary lymphadenopathy. The patient will be  contacted to schedule right axillary ultrasound probable biopsy.  3. No evidence of a biopsy clip within the biopsy-proven  carcinoma. This was discussed with Dr. Darrall Dears nurse, Vikki Ports,  on 06/06/2011. We will plan to place a biopsy clip within the mass  at the time of  her right axillary lymph node biopsy.  4. No evidence of malignancy in the left breast.  5. Small amount of pleural fluid on the right, asymmetric compared  to the left side.  THREE-DIMENSIONAL MR IMAGE RENDERING ON INDEPENDENT WORKSTATION:  Three-dimensional MR images were rendered by post-processing of the  original MR data on an independent workstation. The three-  dimensional MR images were interpreted, and findings were reported  in the accompanying complete MRI report for this study.  BI-RADS CATEGORY 4: Suspicious abnormality - biopsy should be  considered.  Original Report Authenticated By: Britta Mccreedy, M.D.    ASSESSMENT:  1. Right upper quadrant triple negative breast cancer that was clinically 5 x 7 cm though on MRI it was 4 cm. She also had CT with abnormal nodes in the axilla and along the chest wall where there is either lymph node or extension tumor to the pectorals major and also confirmed by the MRI of the breast.  2. LE pain/discomfort secondary to Taxol chemotherapy and/or Neulasta support.  3. History of asthma as a child with recurrence after third pregnancy.  4. Sleep apnea on a CPAP machine.  5. Mild obesity.  6. History of hysterectomy in 2004.  7. Deep venous thrombosis with a negative workup in 2000, treated with Coumadin for 6 months at that time.  8. History of hyperlipidemia.  9. Restless legs syndrome.  10. History of breast cancer in her family, namely her mother's mother had breast cancer. Her mother, herself, had colon cancer. Her mother's sister had colon cancer with recurrence x2. Another sister died of ovarian cancer in her 67s. There is a maternal aunt's with 2 children; one who had ductal carcinoma in situ and the other has had triple negative breast cancer in her late 51s  seen at [facility].  11.BRCA1 and BRCA2 negativity, but she may be seen by the geneticist for Lynch syndrome. [name] has called her and told her that she does not have BRCA1  or BRCA2. After this family history discussion, they want her tested for the Lynch syndrome and that blood has been sent.  12. Dry eyes, taking Replens  13. Hemangioma, unclear etiology     PLAN:  1. More than 50% of the time spent with the patient was utilized for counseling and coordination of care. 2. We discussed the differences between a sentinel node biopsy and a lymph node dissection.  She understands that a dissection is  more aggressive and removes more lymph nodes.  We kept our discussion generalized and I referred the patient to her surgeon for more in depth details. 3. I personally reviewed and went over radiographic studies with the patient.  We discussed her 12/27 and 12/28 MRI and CT scan.  I highlighted the area of the reports that spoke about possible pectoralis involvement.   4. We discussed oncology and radiation oncology's opinion regarding the role of a lymph node dissection for her case.  We are in agreement.  She understands that her surgeon may have a difference in opinion.  Therefore, her case will be discussed again at the breast cancer tumor board.  5. She is going to have lab work performed by her PCP on Monday.  I provided her an Rx outlining pre-chemo therapy lab work which we will ask her PCP to ascertain: CBC diff, and complete metabolic panel.  We request that these results be faxed to the [facility] at [phone].   6. She will undergo cycle 4 of Taxol chemotherapy as scheduled on Tuesday 09/24/2011.  This will complete her chemotherapy.  She will then undergo surgical resection (mastectomy) and lymph node dissection. 7. Patient wishes to return in 3 weeks prior to surgery to see how cycle 4 went for her and discuss her upcoming interventions.    All questions were answered. The patient knows to call the clinic with any problems, questions or concerns. We can certainly see the patient much sooner if necessary.  The patient and plan discussed with Glenford Peers, MD and he is in agreement with the aforementioned.  Jamilynn Whitacre

## 2011-09-18 NOTE — Progress Notes (Signed)
Encounter addended by: Agnes Lawrence, RN on: 09/18/2011 11:35 AM<BR>     Documentation filed: Charges VN, Inpatient Patient Education

## 2011-09-23 ENCOUNTER — Other Ambulatory Visit (HOSPITAL_COMMUNITY): Payer: BC Managed Care – PPO

## 2011-09-24 ENCOUNTER — Encounter (HOSPITAL_BASED_OUTPATIENT_CLINIC_OR_DEPARTMENT_OTHER): Payer: BC Managed Care – PPO

## 2011-09-24 VITALS — BP 135/84 | HR 90 | Temp 98.8°F | Wt 214.4 lb

## 2011-09-24 DIAGNOSIS — Z5111 Encounter for antineoplastic chemotherapy: Secondary | ICD-10-CM

## 2011-09-24 DIAGNOSIS — C50419 Malignant neoplasm of upper-outer quadrant of unspecified female breast: Secondary | ICD-10-CM

## 2011-09-24 MED ORDER — HEPARIN SOD (PORK) LOCK FLUSH 100 UNIT/ML IV SOLN
INTRAVENOUS | Status: AC
  Filled 2011-09-24: qty 5

## 2011-09-24 MED ORDER — HEPARIN SOD (PORK) LOCK FLUSH 100 UNIT/ML IV SOLN
500.0000 [IU] | Freq: Once | INTRAVENOUS | Status: AC | PRN
  Administered 2011-09-24: 500 [IU]
  Filled 2011-09-24: qty 5

## 2011-09-24 MED ORDER — SODIUM CHLORIDE 0.9 % IV SOLN
Freq: Once | INTRAVENOUS | Status: AC
  Administered 2011-09-24: 09:00:00 via INTRAVENOUS

## 2011-09-24 MED ORDER — SODIUM CHLORIDE 0.9 % IV SOLN
Freq: Once | INTRAVENOUS | Status: AC
  Administered 2011-09-24: 8 mg via INTRAVENOUS
  Filled 2011-09-24: qty 4

## 2011-09-24 MED ORDER — DEXAMETHASONE SODIUM PHOSPHATE 4 MG/ML IJ SOLN: 20.0000 mg | Freq: Once | INTRAMUSCULAR | Status: DC

## 2011-09-24 MED ORDER — PACLITAXEL CHEMO INJECTION 300 MG/50ML
175.0000 mg/m2 | Freq: Once | INTRAVENOUS | Status: AC
  Administered 2011-09-24: 378 mg via INTRAVENOUS
  Filled 2011-09-24: qty 63

## 2011-09-24 MED ORDER — SODIUM CHLORIDE 0.9 % IV SOLN: 8.0000 mg | Freq: Once | INTRAVENOUS | Status: DC

## 2011-09-24 MED ORDER — DIPHENHYDRAMINE HCL 50 MG/ML IJ SOLN
INTRAMUSCULAR | Status: AC
  Filled 2011-09-24: qty 1

## 2011-09-24 MED ORDER — DIPHENHYDRAMINE HCL 50 MG/ML IJ SOLN
50.0000 mg | Freq: Once | INTRAMUSCULAR | Status: AC
  Administered 2011-09-24: 50 mg via INTRAVENOUS

## 2011-09-24 MED ORDER — FAMOTIDINE IN NACL 20-0.9 MG/50ML-% IV SOLN
20.0000 mg | Freq: Once | INTRAVENOUS | Status: AC
  Administered 2011-09-24: 20 mg via INTRAVENOUS

## 2011-09-24 MED ORDER — FAMOTIDINE IN NACL 20-0.9 MG/50ML-% IV SOLN
INTRAVENOUS | Status: AC
  Filled 2011-09-24: qty 50

## 2011-09-25 ENCOUNTER — Encounter (HOSPITAL_BASED_OUTPATIENT_CLINIC_OR_DEPARTMENT_OTHER): Payer: BC Managed Care – PPO

## 2011-09-25 VITALS — BP 130/81 | HR 89 | Temp 98.3°F

## 2011-09-25 DIAGNOSIS — Z5189 Encounter for other specified aftercare: Secondary | ICD-10-CM

## 2011-09-25 DIAGNOSIS — C50419 Malignant neoplasm of upper-outer quadrant of unspecified female breast: Secondary | ICD-10-CM

## 2011-09-25 MED ORDER — PEGFILGRASTIM INJECTION 6 MG/0.6ML
SUBCUTANEOUS | Status: AC
  Filled 2011-09-25: qty 0.6

## 2011-09-25 MED ORDER — PEGFILGRASTIM INJECTION 6 MG/0.6ML
6.0000 mg | Freq: Once | SUBCUTANEOUS | Status: AC
  Administered 2011-09-25: 6 mg via SUBCUTANEOUS

## 2011-09-25 NOTE — Progress Notes (Signed)
Renee Serrano presents today for injection per MD orders. Neulasta 6mg  administered SQ in left Abdomen. Administration without incident. Patient tolerated well.

## 2011-09-30 ENCOUNTER — Encounter (HOSPITAL_COMMUNITY): Payer: Self-pay | Admitting: Pharmacy Technician

## 2011-09-30 NOTE — Progress Notes (Signed)
Labs drawn

## 2011-10-08 NOTE — Pre-Procedure Instructions (Addendum)
20 Renee Serrano  10/08/2011   Your procedure is scheduled on:  Wednesday May 8  Report to [facility] at 6:30 AM.  Call this number if you have problems the morning of surgery: [phone]   Remember:   Do not eat food:After Midnight.  May have clear liquids: up to 4 Hours before arrival.  Clear liquids include soda, tea, black coffee, apple or grape juice, broth.  Take these medicines the morning of surgery with A SIP OF WATER: Ativan if needed. May use nasonex spray.   Do not wear jewelry, make-up or nail polish.  Do not wear lotions, powders, or perfumes. You may wear deodorant.  Do not shave 48 hours prior to surgery.  Do not bring valuables to the hospital.  Contacts, dentures or bridgework may not be worn into surgery.  Leave suitcase in the car. After surgery it may be brought to your room.  For patients admitted to the hospital, checkout time is 11:00 AM the day of discharge.   Patients discharged the day of surgery will not be allowed to drive home.  Name and phone number of your driver: NA  Special Instructions: CHG Shower Use Special Wash: 1/2 bottle night before surgery and 1/2 bottle morning of surgery.   Please read over the following fact sheets that you were given: Pain Booklet, Coughing and Deep Breathing and Surgical Site Infection Prevention

## 2011-10-09 ENCOUNTER — Encounter (HOSPITAL_COMMUNITY): Payer: BC Managed Care – PPO | Attending: Oncology | Admitting: Oncology

## 2011-10-09 ENCOUNTER — Encounter (HOSPITAL_COMMUNITY)
Admission: RE | Admit: 2011-10-09 | Discharge: 2011-10-09 | Disposition: A | Discharge status: Home / Self Care | Payer: BC Managed Care – PPO | Source: Ambulatory Visit | Attending: Anesthesiology | Admitting: Anesthesiology

## 2011-10-09 ENCOUNTER — Encounter (HOSPITAL_COMMUNITY): Payer: Self-pay

## 2011-10-09 ENCOUNTER — Encounter (HOSPITAL_COMMUNITY)
Admission: RE | Admit: 2011-10-09 | Discharge: 2011-10-09 | Disposition: A | Discharge status: Home / Self Care | Payer: BC Managed Care – PPO | Source: Ambulatory Visit | Attending: General Surgery | Admitting: General Surgery

## 2011-10-09 ENCOUNTER — Telehealth (INDEPENDENT_AMBULATORY_CARE_PROVIDER_SITE_OTHER): Payer: Self-pay | Admitting: General Surgery

## 2011-10-09 ENCOUNTER — Telehealth (HOSPITAL_COMMUNITY): Payer: Self-pay | Admitting: General Surgery

## 2011-10-09 VITALS — BP 137/84 | HR 72 | Temp 98.5°F | Resp 20 | Ht 66.0 in | Wt 214.9 lb

## 2011-10-09 VITALS — BP 132/81 | HR 76 | Temp 98.6°F | Ht 66.0 in | Wt 214.0 lb

## 2011-10-09 DIAGNOSIS — M79609 Pain in unspecified limb: Secondary | ICD-10-CM

## 2011-10-09 DIAGNOSIS — C50419 Malignant neoplasm of upper-outer quadrant of unspecified female breast: Secondary | ICD-10-CM

## 2011-10-09 DIAGNOSIS — Z86718 Personal history of other venous thrombosis and embolism: Secondary | ICD-10-CM

## 2011-10-09 DIAGNOSIS — C50911 Malignant neoplasm of unspecified site of right female breast: Secondary | ICD-10-CM

## 2011-10-09 LAB — BASIC METABOLIC PANEL
BUN: 16 mg/dL (ref 6–23)
CO2: 27 mEq/L (ref 19–32)
Chloride: 101 mEq/L (ref 96–112)
Creatinine, Ser: 0.67 mg/dL (ref 0.50–1.10)
GFR calc Af Amer: >90 mL/min (ref >90)
Potassium: 4 mEq/L (ref 3.5–5.1)

## 2011-10-09 LAB — HEPATIC FUNCTION PANEL
ALT: 49 U/L — ABNORMAL HIGH (ref 0–35)
AST: 30 U/L (ref 0–37)
Alkaline Phosphatase: 97 U/L (ref 39–117)
Bilirubin, Direct: <0.1 mg/dL (ref 0.0–0.3)
Total Protein: 6.7 g/dL (ref 6.0–8.3)

## 2011-10-09 LAB — CBC
HCT: 32.9 % — ABNORMAL LOW (ref 36.0–46.0)
Hemoglobin: 11 g/dL — ABNORMAL LOW (ref 12.0–15.0)
MCHC: 33.4 g/dL (ref 30.0–36.0)
MCV: 98.2 fL (ref 78.0–100.0)

## 2011-10-09 LAB — SURGICAL PCR SCREEN: Staphylococcus aureus: NEGATIVE

## 2011-10-09 NOTE — Telephone Encounter (Signed)
Marisue Ivan, calling for [facility], needs a new order for this pt's surgical consent, please.

## 2011-10-09 NOTE — Progress Notes (Signed)
Spoke with Britta Mccreedy in nursing office at [facility]. Notified her that surgery is on schedule for 0800 and we do not start until 0830 that day. Also notified her that based on Dr. Delice Lesch note from today a new order for consent is probably needed since surgical plan has changed.

## 2011-10-09 NOTE — Progress Notes (Signed)
Anesthesia consult ordered, chart given to A. Zelenak PA-C and case discussed. Pt asked to wait for consultation.   Requested sleep study from Dr. Egbert Garibaldi at [facility].

## 2011-10-09 NOTE — Telephone Encounter (Signed)
I called the patient to answer some questions about her upcoming surgery.  She is wanting to proceed to MRM instead of mastectomy with SLN bx.  I again explained that all information that we have has been negative to date and there is no evidence of cancer in lymph nodes other than enlarged node on MRI, and we were planning to evaluate the nodes with SLN bx which would possibly require completion dissection.  I again explained that there is no survival benefit to this and it will not change her treatment as well since she has received chemo and is going to receive radiation as well.  I discussed with her the increased risks of lymphedema and nerve injury.  She expressed understanding of increased risks and wants to change to MRM.

## 2011-10-09 NOTE — Progress Notes (Signed)
Renee Dauer, MD, MD [address] [place] [address]  1. Cancer of upper-outer quadrant of female breast     CURRENT THERAPY:Status post 4 cycles of Cytoxan and Adriamycin and now S/P 4 cycle of Taxol.  She will then pursue surgical intervention followed by radiation.   INTERVAL HISTORY: Renee Serrano 56 y.o. 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  She is accompanied by her husband today.  Renee Serrano has had a long discussion with her surgeon, Dr. Biagio Quint.  Dr. Biagio Quint has been agreeable to the patient's request to have a mastectomy with node dissection.  However, the patient's surgeon would prefer to do a sentinel node biopsy. After a long discussion as documented in a telephone encounter, Dr. Biagio Quint has been agreeable to perform a node dissection.  The patient reports that she feels as though "everyone is in one canoe while her surgeon is in another canoe."  Radiation oncology and medical oncology are both in a consensus that the patient will require a node dissection in light of her worrisome MRI finding for a probable satellite nodule abutting and possibly involving the right pectoralis musculature. We spent some time discussing this. The patient is in agreement with our recommendation. In light of her surgeon's initial hesitancy regarding this procedure, Renee Serrano would like to pursue a second opinion by one of Dr. Delice Lesch colleagues.  Therefore I have reached out to Dr. Dwain Sarna who has graciously accepted seeing this patient in consultation for a second opinion. Renee Serrano is adamant that she wants her surgery to be performed as scheduled by Dr Biagio Quint, but would like another surgeon's opinion regarding her options. Dr. Dwain Sarna reports that he'll be able to see her prior to surgery.  She reports that she is very confident and comfortable with Dr. Biagio Quint, but she desires another opinion since there is some disagreement regarding her surgical intervention.  The patient will undergo surgery as schedule on May 8.  In the interim, she will undergo a second opinion by Dr. Dwain Sarna.  The patient of course is emotional during the above mentioned discussion. We spent a significant amount time discussing her options.   Past Medical History  Diagnosis Date  . DVT (deep venous thrombosis)   . Sinus drainage   . Complication of anesthesia     paniced first surg-had to sedate prior to surg  . Anxiety   . DVT (deep venous thrombosis) 2000    does not know why-takes asa  . Allergy   . Asthma   . Sleep apnea     does use cpap  . Breast cancer 05/2011    right breast ca  . Hyperlipidemia   . Restless leg syndrome     has Cancer of upper-outer quadrant of female breast on her problem list.     is allergic to penicillins and hydrocodone.  Renee Serrano does not currently have medications on file.  Past Surgical History  Procedure Date  . Functional endoscopic sinus surgery   . Colonoscopy   . Portacath placement 06/17/2011    Procedure: INSERTION PORT-A-CATH;  Surgeon: Rulon Abide, DO;  Location: [facility];  Service: General;  Laterality: Left;  left side mediport placement  . Breast biopsy 05/07/2011    right, ER/PR -, HER 2 -  . Abdominal  hysterectomy 2004    Denies any headaches, dizziness, double vision, fevers, chills, night sweats, nausea, vomiting, diarrhea, constipation, chest pain, heart palpitations, shortness of breath, blood in stool, black tarry stool, urinary pain, urinary burning, urinary frequency, hematuria.   PHYSICAL EXAMINATION  ECOG PERFORMANCE STATUS: 0 - Asymptomatic  Filed Vitals:   10/09/11 1444  BP: 132/81  Pulse: 76  Temp: 98.6 F (37 C)    GENERAL:alert, no distress, well nourished, well developed, comfortable,  cooperative, obese, smiling and emotional. SKIN: skin color, texture, turgor are normal, no rashes or significant lesions HEAD: Normocephalic, No masses, lesions, tenderness or abnormalities EYES: normal, PERRLA, EOMI, Conjunctiva are pink and non-injected EARS: External ears normal OROPHARYNX:lips, buccal mucosa, and tongue normal and mucous membranes are moist  NECK: supple, trachea midline LYMPH:  not examined BREAST:not examined LUNGS: not examined HEART: not examined ABDOMEN:not examined BACK: not examined EXTREMITIES:not examined  NEURO: alert & oriented x 3 with fluent speech, no focal motor/sensory deficits, gait normal   RADIOGRAPHIC STUDIES:  Dg Chest 2 View  10/09/2011  *RADIOLOGY REPORT*  Clinical Data: Preoperative evaluation.  History of asthma.  CHEST - 2 VIEW  Comparison: None.  Findings: Tip of left-sided power Port-A-Cath terminates in superior vena cava.  No pneumothorax is evident.  Slight prominence of left hilum most likely is vascular. Normal cardiac size and shape.  No pulmonary nodules evident.  No pleural effusions are seen.  Slight increased markings along left cardiac margin may reflect minimal atelectasis or fibrosis.  Changes of degenerative disc disease and degenerative spondylosis are seen.  IMPRESSION: Port-A-Cath in place. Normal sized heart.  No pulmonary nodules or pleural effusions evident.  Slight increased markings along the left cardiac margin on PA image may reflect minimal atelectasis or fibrosis.  Original Report Authenticated By: Crawford Givens, M.D.   06/07/2011  *RADIOLOGY REPORT*  Clinical Data: Staging newly diagnosed right breast cancer  CT CHEST WITH CONTRAST  Technique: Multidetector CT imaging of the chest was performed  following the standard protocol during bolus administration of  intravenous contrast.  Contrast: 80mL OMNIPAQUE IOHEXOL 300 MG/ML IV SOLN  Comparison: None.  Findings: 3.0 x 3.0 cm mass in the central right breast (series    2/image 31), likely reflecting the patient's known primary breast  malignancy. Mild stranding/irregularity of the underlying  pectoralis muscle medially (series 2/image 30), with possible  abnormal soft tissue just anterior to the pectoralis muscle  inferiorly (series 2/image 33).  1.5 cm short-axis enlarged right axillary lymph node (series  2/image 18), suspicious. Additional 1.2 cm short-axis right  axillary/chest wall lymph node (series 2/image 23), suspicious.  Tiny nodules / subpleural opacities in the bilateral lungs,  including:  --2 mm subpleural opacity in the lateral right upper lobe (series  5/image 16)  --4 mm nodule in the left upper lobe (series 5/image 20)  --5 mm subpleural opacity in the posterior left lower lobe (series  5/image 28)  --3 mm nodule along the left fissure (series 5/image 29)  Visualized thyroid is unremarkable.  The heart is normal in size. No pericardial effusion.  No suspicious mediastinal or hilar lymphadenopathy.  Visualized upper abdomen is notable for a tiny hiatal hernia.  Mild degenerative changes of the visualized thoracolumbar spine.  IMPRESSION:  3.0 cm mass in the central right breast, likely reflecting the  patient's known primary breast malignancy. Findings worrisome for  possible involvement of the underlying pectoralis muscle, as  described above.  1.5 cm short-axis enlarged right axillary lymph node,  suspicious.  1.2 cm short-axis right axillary/chest wall lymph node, suspicious.  Tiny subpleural opacities / pulmonary nodules bilaterally,  measuring up to 5 mm. The appearance is nonspecific and  statistically likely benign. However, given known malignancy,  close attention on follow-up is suggested.  Original Report Authenticated By: Charline Bills, M.D.  06/06/2011  *RADIOLOGY REPORT*  Clinical Data: Newly diagnosed right breast cancer. Ultrasound  guided core needle biopsy of a right breast mass in the 9:30  position of the  right breast was performed in [place], [place] on  05/07/2011.  BILATERAL BREAST MRI WITH AND WITHOUT CONTRAST  Technique: Multiplanar, multisequence MR images of both breasts  were obtained prior to and following the intravenous administration  of 20ml of Multihance. Three dimensional images were evaluated at  the independent DynaCad workstation.  Comparison: Bilateral screening mammogram 03/26/2011 from [facility] [place], [place]. Diagnostic right mammogram and  right breast ultrasound 04/09/2011 right breast ultrasound from  [facility] [place], [place].  Findings: There is an irregular heterogeneously enhancing mass in  the posterior third of the central and slightly outer right breast  near the level of the nipple, with some central areas of necrosis.  The mass measures approximately 3.7 x 4.0 x 3.2 cm (AP by  transverse by craniocaudal dimensions). Enhancement curves of the  mass include washout and plateau kinetics.  Extending anteriorly from the lateral aspect of the mass is  abnormal linearly oriented enhancement, highly suspicious for  associated ductal carcinoma in situ. 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  There is a 1.5 x 0.7 x 1.1 cm heterogeneously enhancing slightly  irregular nodule directly posterior to the dominant mass that abuts  the inferior aspect of the right pectoralis minor muscle. This  nodule demonstrates washout kinetics and is highly suspicious for a  satellite nodule. On sagittal images, it is difficult to separate  this probable malignant nodule from the pectoralis muscle, and  involvement of the musculature by tumor cannot be excluded.  In total, the abnormal areas of enhancement in the right breast  measure 10 cm anterior to posterior dimension.  There are no findings to suggest  malignancy in the left breast.  Right axillary lymph nodes are increased in number. Two markedly  abnormal right axillary lymph nodes are seen, with thickened  cortices and near loss of the fatty hila. The largest lymph node  measures 1.5 cm short axis. There is an a an asymmetric slightly  suspicious subpectoral lymph node on the right that measures 5 mm  short axis. No suspicious left axillary lymph nodes. Negative for  internal mammary chain lymphadenopathy.  A small amount of pleural fluid is seen on the right. While this  amount can be within normal limits, it is asymmetric compared to  the left side.  IMPRESSION:  1. 4 cm irregular heterogeneously enhancing mass in the central  slightly outer right breast corresponds to biopsy-proven  malignancy. The extent of malignancy is likely greater than  expected by recent mammogram and ultrasound. Abnormal linear  enhancement extend anteriorly and posteriorly from the mass  (suspicious for DCIS) and a probable satellite nodule abuts and may  involve the right pectoralis musculature. Abnormal enhancement  spans 10 cm in maximal AP diameter.  If thought to be useful for treatment planning purposes, a second  biopsy could be performed of the linear enhancement extending  anteriorly  from the mass under MRI guidance.  2. Pathologic right axillary lymphadenopathy. The patient will be  contacted to schedule right axillary ultrasound probable biopsy.  3. No evidence of a biopsy clip within the biopsy-proven  carcinoma. This was discussed with Dr. Darrall Dears Serrano, Vikki Ports,  on 06/06/2011. We will plan to place a biopsy clip within the mass  at the time of her right axillary lymph node biopsy.  4. No evidence of malignancy in the left breast.  5. Small amount of pleural fluid on the right, asymmetric compared  to the left side.  THREE-DIMENSIONAL MR IMAGE RENDERING ON INDEPENDENT WORKSTATION:  Three-dimensional MR images were rendered by  post-processing of the  original MR data on an independent workstation. The three-  dimensional MR images were interpreted, and findings were reported  in the accompanying complete MRI report for this study.  BI-RADS CATEGORY 4: Suspicious abnormality - biopsy should be  considered.  Original Report Authenticated By: Britta Mccreedy, M.D.     ASSESSMENT:  1. Right upper quadrant triple negative breast cancer that was clinically 5 x 7 cm though on MRI it was 4 cm. She also had CT with abnormal nodes in the axilla and along the chest wall where there is either lymph node or extension tumor to the pectorals major and also confirmed by the MRI of the breast.  2. LE pain/discomfort secondary to Taxol chemotherapy and/or Neulasta support.  3. History of asthma as a child with recurrence after third pregnancy.  4. Sleep apnea on a CPAP machine.  5. Mild obesity.  6. History of hysterectomy in 2004.  7. Deep venous thrombosis with a negative workup in 2000, treated with Coumadin for 6 months at that time.  8. History of hyperlipidemia.  9. Restless legs syndrome.  10. History of breast cancer in her family, namely her mother's mother had breast cancer. Her mother, herself, had colon cancer. Her mother's sister had colon cancer with recurrence x2. Another sister died of ovarian cancer in her 1s. There is a maternal aunt's with 2 children; one who had ductal carcinoma in situ and the other has had triple negative breast cancer in her late 55s  seen at [facility].  11.BRCA1 and BRCA2 negativity, but she may be seen by the geneticist for Lynch syndrome. Renee Serrano has called her and told her that she does not have BRCA1 or BRCA2. After this family history discussion, they want her tested for the Lynch syndrome and that blood has been sent.  12. Dry eyes, taking Replens  13. Hemangioma, unclear etiology    PLAN:  1. We had a long discussion regarding her upcoming surgery. I've offered the patient a  second opinion do to a lack of agreement about the most appropriate surgical intervention for her breast cancer. I've offered her a opinion at any [facility] or [facility] she would like to go to. I've also offered her a second opinion at any of Dr. Biagio Quint colleagues.  The patient is adamant about undergoing her surgery as scheduled on May 8. Therefore, she would like to ascertain a second opinion from Dr. Dwain Sarna. 2. I personally spoke with Dr. Dwain Sarna and he is agreeable to see the patient in consultation prior to her surgery. He reports that his staff will contact the patient with an appointment. 3. I contact the patient to let her know about the information in #2. 4. The patient will undergo surgical intervention on May 8 as scheduled.  The patient will return to  clinic in 2 weeks  following surgery. We will then get her an appointment with radiation oncology for radiation therapy.   All questions were answered. The patient knows to call the clinic with any problems, questions or concerns. We can certainly see the patient much sooner if necessary.  Laura Caldas

## 2011-10-09 NOTE — Consult Note (Signed)
Anesthesia Consult:  Patient is a 56 year old female scheduled for a modified radical mastectomy of the right breast. On 10/16/11.  She is s/p neoadjuvant chemotherapy (Cytoxan, Adriamycin, Taxol), last treat in April 2013.  I saw her earlier today during her PAT visit.  History includes LE DVT '00, anxiety, asthma, OSA with use of CPAP.   EKG on 10/09/11 showed NSR, possible anterior infarct (age undetermined).  I don't think that it is significantly changed since her EKG from 03/19/10 (from her PCP Dr. Suzy Bouchard in [place], [place]).  Echo on 07/23/11 showed: - Left ventricle: The cavity size was normal. There was mild concentric hypertrophy. Systolic function was normal. The estimated ejection fraction was in the range of 55% to 60%. Wall motion was normal; there were no regional wall motion abnormalities. - Right ventricle: The cavity size was normal. Wall thickness was mildly increased. - Atrial septum: No defect or patent foramen ovale was Identified.  She denies CP, SOB, history of MI/CHF, edema.  She is able to walk on a treadmill for two miles.  Exam shows her heart with a RRR, no murmur appreciated, lungs clear, no carotid bruits noted.  No LE edema.  CXR on 10/09/11 showed Port-A-Cath in place. Normal sized heart. No pulmonary nodules or pleural effusions evident. Slight increased markings along the left cardiac margin on PA image may reflect minimal atelectasis or fibrosis.  Labs acceptable.  When I spoke with her and her husband today they expressed some frustration about not feeling like her surgeon and Oncologist were communicating.  She is actually being seen at the [facility] today, and I was able to speak with Dr. Biagio Quint who called and spoke with her. (I asked him to also clarify with the patient when/if she is suppose to hold her ASA.)  Based on her conversation with Dr. Biagio Quint, she will be having a MRM on the right.  Her PAT nurse has already requested that a new consent order  be placed.  Plan to proceed.  She does have some anxiety associated with surgery.  She plans to take her Ativan prior to her hospital arrival.  Anesthesiologist Dr. Michelle Piper agrees with this plan.  Shonna Chock, PA-C

## 2011-10-09 NOTE — Patient Instructions (Signed)
[facility]  Discharge Instructions  RECOMMENDATIONS MADE BY THE CONSULTANT AND ANY TEST RESULTS WILL BE SENT TO YOUR REFERRING DOCTOR.   EXAM FINDINGS BY MD TODAY AND SIGNS AND SYMPTOMS TO REPORT TO CLINIC OR PRIMARY MD:    SPECIAL INSTRUCTIONS/FOLLOW-UP: Awaiting call from Dr. Dwain Sarna We will be in contact Return 2 weeks after surgery.   I acknowledge that I have been informed and understand all the instructions given to me and received a copy. I do not have any more questions at this time, but understand that I may call the Specialty Clinic at [facility] at [phone] during business hours should I have any further questions or need assistance in obtaining follow-up care.    __________________________________________  _____________  __________ Signature of Patient or Authorized Representative            Date                   Time    __________________________________________ Nurse's Signature

## 2011-10-10 ENCOUNTER — Telehealth (INDEPENDENT_AMBULATORY_CARE_PROVIDER_SITE_OTHER): Payer: Self-pay

## 2011-10-10 NOTE — Telephone Encounter (Signed)
Called pt to make her 2nd opinion appt with Dr Dwain Sarna for next week and she declined the appt now. The pt received a call from Dr Biagio Quint this am and she feels much better about going ahead with the planned surgery with Dr Biagio Quint. The pt said Dr Biagio Quint told her that he spoke to Dr Dwain Sarna and Dr Dayton Scrape about her case with all the doctors now being in agreement with one another. The pt feels much better knowing the doctors are in agreement with each other that she doesn't see it necessary to have another opinion. I will notify Dr Dwain Sarna.

## 2011-10-14 ENCOUNTER — Other Ambulatory Visit (HOSPITAL_COMMUNITY): Payer: Self-pay | Admitting: Oncology

## 2011-10-14 DIAGNOSIS — C50419 Malignant neoplasm of upper-outer quadrant of unspecified female breast: Secondary | ICD-10-CM

## 2011-10-14 MED ORDER — MOMETASONE FUROATE 50 MCG/ACT NA SUSP: 2.0000 | Freq: Every day | NASAL | Status: DC

## 2011-10-14 MED ORDER — FLUTICASONE PROPIONATE 50 MCG/ACT NA SUSP: 2.0000 | Freq: Every day | NASAL | Status: DC

## 2011-10-15 MED ORDER — HEPARIN SODIUM (PORCINE) 5000 UNIT/ML IJ SOLN
5000.0000 [IU] | Freq: Once | INTRAMUSCULAR | Status: AC
  Administered 2011-10-16: 5000 [IU] via SUBCUTANEOUS
  Filled 2011-10-15: qty 1

## 2011-10-16 ENCOUNTER — Encounter (HOSPITAL_COMMUNITY): Payer: Self-pay | Admitting: General Practice

## 2011-10-16 ENCOUNTER — Inpatient Hospital Stay (HOSPITAL_COMMUNITY): Admission: RE | Admit: 2011-10-16 | Payer: BC Managed Care – PPO | Source: Ambulatory Visit

## 2011-10-16 ENCOUNTER — Encounter (HOSPITAL_COMMUNITY): Payer: Self-pay | Admitting: *Deleted

## 2011-10-16 ENCOUNTER — Inpatient Hospital Stay (HOSPITAL_COMMUNITY)
Admission: RE | Admit: 2011-10-16 | Discharge: 2011-10-17 | DRG: 258 | Disposition: A | Discharge status: Home / Self Care | Payer: BC Managed Care – PPO | Source: Ambulatory Visit | Attending: General Surgery | Admitting: General Surgery

## 2011-10-16 ENCOUNTER — Ambulatory Visit (HOSPITAL_COMMUNITY): Payer: BC Managed Care – PPO | Admitting: Vascular Surgery

## 2011-10-16 ENCOUNTER — Other Ambulatory Visit (HOSPITAL_COMMUNITY): Payer: BC Managed Care – PPO

## 2011-10-16 ENCOUNTER — Encounter (HOSPITAL_COMMUNITY)
Admission: RE | Disposition: A | Discharge status: Home / Self Care | Payer: Self-pay | Source: Ambulatory Visit | Attending: General Surgery

## 2011-10-16 ENCOUNTER — Encounter (HOSPITAL_COMMUNITY): Payer: Self-pay | Admitting: Vascular Surgery

## 2011-10-16 DIAGNOSIS — Z86718 Personal history of other venous thrombosis and embolism: Secondary | ICD-10-CM

## 2011-10-16 DIAGNOSIS — Z800 Family history of malignant neoplasm of digestive organs: Secondary | ICD-10-CM

## 2011-10-16 DIAGNOSIS — E785 Hyperlipidemia, unspecified: Secondary | ICD-10-CM | POA: Diagnosis present

## 2011-10-16 DIAGNOSIS — Z803 Family history of malignant neoplasm of breast: Secondary | ICD-10-CM

## 2011-10-16 DIAGNOSIS — Z880 Allergy status to penicillin: Secondary | ICD-10-CM

## 2011-10-16 DIAGNOSIS — C50919 Malignant neoplasm of unspecified site of unspecified female breast: Secondary | ICD-10-CM

## 2011-10-16 DIAGNOSIS — Z7982 Long term (current) use of aspirin: Secondary | ICD-10-CM

## 2011-10-16 DIAGNOSIS — C50911 Malignant neoplasm of unspecified site of right female breast: Secondary | ICD-10-CM

## 2011-10-16 DIAGNOSIS — C50419 Malignant neoplasm of upper-outer quadrant of unspecified female breast: Principal | ICD-10-CM | POA: Diagnosis present

## 2011-10-16 DIAGNOSIS — Z79899 Other long term (current) drug therapy: Secondary | ICD-10-CM

## 2011-10-16 DIAGNOSIS — J45909 Unspecified asthma, uncomplicated: Secondary | ICD-10-CM | POA: Diagnosis present

## 2011-10-16 DIAGNOSIS — G2581 Restless legs syndrome: Secondary | ICD-10-CM | POA: Diagnosis present

## 2011-10-16 DIAGNOSIS — F411 Generalized anxiety disorder: Secondary | ICD-10-CM | POA: Diagnosis present

## 2011-10-16 HISTORY — PX: MASTECTOMY MODIFIED RADICAL W/ AXILLARY LYMPH NODES W/ OR W/O PECTORALIS MINOR: SUR850

## 2011-10-16 HISTORY — DX: Migraine, unspecified, not intractable, without status migrainosus: G43.909

## 2011-10-16 SURGERY — MASTECTOMY, MODIFIED RADICAL
Anesthesia: General | Site: Breast | Laterality: Right | Wound class: Clean

## 2011-10-16 MED ORDER — LACTATED RINGERS IV SOLN: INTRAVENOUS | Status: DC

## 2011-10-16 MED ORDER — OXYCODONE-ACETAMINOPHEN 5-325 MG PO TABS
1.0000 | ORAL_TABLET | ORAL | Status: DC | PRN
  Administered 2011-10-16: 1 via ORAL
  Administered 2011-10-16 – 2011-10-17 (×4): 2 via ORAL
  Filled 2011-10-16 (×2): qty 2
  Filled 2011-10-16: qty 1
  Filled 2011-10-16 (×2): qty 2

## 2011-10-16 MED ORDER — DOCUSATE SODIUM 100 MG PO CAPS
200.0000 mg | ORAL_CAPSULE | Freq: Two times a day (BID) | ORAL | Status: DC
  Administered 2011-10-16 – 2011-10-17 (×3): 200 mg via ORAL
  Filled 2011-10-16: qty 2
  Filled 2011-10-16 (×3): qty 1

## 2011-10-16 MED ORDER — LIDOCAINE-EPINEPHRINE (PF) 1 %-1:200000 IJ SOLN
INTRAMUSCULAR | Status: DC | PRN
  Administered 2011-10-16: 11:00:00 via INTRAMUSCULAR

## 2011-10-16 MED ORDER — FLUTICASONE PROPIONATE 50 MCG/ACT NA SUSP
2.0000 | Freq: Every day | NASAL | Status: DC
  Administered 2011-10-16 – 2011-10-17 (×2): 2 via NASAL
  Filled 2011-10-16: qty 16

## 2011-10-16 MED ORDER — ONDANSETRON HCL 4 MG PO TABS: 4.0000 mg | ORAL_TABLET | Freq: Four times a day (QID) | ORAL | Status: DC | PRN

## 2011-10-16 MED ORDER — CITALOPRAM HYDROBROMIDE 40 MG PO TABS
40.0000 mg | ORAL_TABLET | Freq: Every day | ORAL | Status: DC
  Administered 2011-10-16 – 2011-10-17 (×2): 40 mg via ORAL
  Filled 2011-10-16 (×2): qty 1

## 2011-10-16 MED ORDER — LORAZEPAM 1 MG PO TABS: 1.0000 mg | ORAL_TABLET | Freq: Four times a day (QID) | ORAL | Status: DC | PRN

## 2011-10-16 MED ORDER — FENTANYL CITRATE 0.05 MG/ML IJ SOLN
INTRAMUSCULAR | Status: DC | PRN
  Administered 2011-10-16 (×9): 50 ug via INTRAVENOUS

## 2011-10-16 MED ORDER — 0.9 % SODIUM CHLORIDE (POUR BTL) OPTIME
TOPICAL | Status: DC | PRN
  Administered 2011-10-16: 1000 mL

## 2011-10-16 MED ORDER — KCL IN DEXTROSE-NACL 20-5-0.45 MEQ/L-%-% IV SOLN
INTRAVENOUS | Status: DC
  Administered 2011-10-16 – 2011-10-17 (×2): via INTRAVENOUS
  Filled 2011-10-16 (×6): qty 1000

## 2011-10-16 MED ORDER — ONDANSETRON HCL 4 MG/2ML IJ SOLN
INTRAMUSCULAR | Status: DC | PRN
  Administered 2011-10-16: 4 mg via INTRAVENOUS

## 2011-10-16 MED ORDER — DEXAMETHASONE SODIUM PHOSPHATE 4 MG/ML IJ SOLN
INTRAMUSCULAR | Status: DC | PRN
  Administered 2011-10-16: 10 mg via INTRAVENOUS

## 2011-10-16 MED ORDER — ROPINIROLE HCL 1 MG PO TABS
1.0000 mg | ORAL_TABLET | Freq: Every day | ORAL | Status: DC
  Administered 2011-10-16: 1 mg via ORAL
  Filled 2011-10-16 (×2): qty 1

## 2011-10-16 MED ORDER — LIDOCAINE HCL (CARDIAC) 20 MG/ML IV SOLN
INTRAVENOUS | Status: DC | PRN
  Administered 2011-10-16: 60 mg via INTRAVENOUS

## 2011-10-16 MED ORDER — SIMVASTATIN 40 MG PO TABS
40.0000 mg | ORAL_TABLET | Freq: Every day | ORAL | Status: DC
  Administered 2011-10-16: 40 mg via ORAL
  Filled 2011-10-16 (×2): qty 1

## 2011-10-16 MED ORDER — HYDROMORPHONE HCL PF 1 MG/ML IJ SOLN
0.2500 mg | INTRAMUSCULAR | Status: DC | PRN
  Administered 2011-10-16: 0.25 mg via INTRAVENOUS

## 2011-10-16 MED ORDER — PROPOFOL 10 MG/ML IV EMUL
INTRAVENOUS | Status: DC | PRN
  Administered 2011-10-16: 160 mg via INTRAVENOUS
  Administered 2011-10-16 (×2): 50 mg via INTRAVENOUS
  Administered 2011-10-16: 40 mg via INTRAVENOUS
  Administered 2011-10-16: 50 mg via INTRAVENOUS

## 2011-10-16 MED ORDER — CLINDAMYCIN PHOSPHATE 600 MG/4ML IJ SOLN
INTRAMUSCULAR | Status: AC
Start: 2011-10-16 — End: 2011-10-16
  Filled 2011-10-16: qty 4

## 2011-10-16 MED ORDER — CLINDAMYCIN PHOSPHATE 600 MG/50ML IV SOLN
INTRAVENOUS | Status: DC | PRN
  Administered 2011-10-16: 600 mg via INTRAVENOUS

## 2011-10-16 MED ORDER — MONTELUKAST SODIUM 10 MG PO TABS
10.0000 mg | ORAL_TABLET | Freq: Every day | ORAL | Status: DC
  Administered 2011-10-16: 10 mg via ORAL
  Filled 2011-10-16 (×2): qty 1

## 2011-10-16 MED ORDER — ONDANSETRON HCL 4 MG/2ML IJ SOLN: 4.0000 mg | Freq: Four times a day (QID) | INTRAMUSCULAR | Status: DC | PRN

## 2011-10-16 MED ORDER — DEXTROSE 5 % IV SOLN
INTRAVENOUS | Status: AC
Start: 2011-10-16 — End: 2011-10-16
  Filled 2011-10-16: qty 50

## 2011-10-16 MED ORDER — LACTATED RINGERS IV SOLN
INTRAVENOUS | Status: DC | PRN
  Administered 2011-10-16 (×3): via INTRAVENOUS

## 2011-10-16 MED ORDER — MORPHINE SULFATE 2 MG/ML IJ SOLN
INTRAMUSCULAR | Status: AC
  Administered 2011-10-16: 2 mg via INTRAVENOUS
  Filled 2011-10-16: qty 1

## 2011-10-16 MED ORDER — ENOXAPARIN SODIUM 40 MG/0.4ML ~~LOC~~ SOLN
40.0000 mg | SUBCUTANEOUS | Status: DC
  Administered 2011-10-17: 40 mg via SUBCUTANEOUS
  Filled 2011-10-16: qty 0.4

## 2011-10-16 MED ORDER — MORPHINE SULFATE 4 MG/ML IJ SOLN: 0.0500 mg/kg | INTRAMUSCULAR | Status: DC | PRN

## 2011-10-16 MED ORDER — SUCCINYLCHOLINE CHLORIDE 20 MG/ML IJ SOLN
INTRAMUSCULAR | Status: DC | PRN
  Administered 2011-10-16: 120 mg via INTRAVENOUS

## 2011-10-16 MED ORDER — MIDAZOLAM HCL 5 MG/5ML IJ SOLN
INTRAMUSCULAR | Status: DC | PRN
  Administered 2011-10-16 (×2): 1 mg via INTRAVENOUS

## 2011-10-16 MED ORDER — MORPHINE SULFATE 2 MG/ML IJ SOLN
2.0000 mg | INTRAMUSCULAR | Status: DC | PRN
  Administered 2011-10-16 (×2): 2 mg via INTRAVENOUS
  Filled 2011-10-16: qty 1

## 2011-10-16 MED ORDER — EPHEDRINE SULFATE 50 MG/ML IJ SOLN
INTRAMUSCULAR | Status: DC | PRN
  Administered 2011-10-16 (×4): 5 mg via INTRAVENOUS

## 2011-10-16 SURGICAL SUPPLY — 48 items
APPLIER CLIP 9.375 MED OPEN (MISCELLANEOUS) ×4
BENZOIN TINCTURE PRP APPL 2/3 (GAUZE/BANDAGES/DRESSINGS) ×4 IMPLANT
BINDER BREAST LRG (GAUZE/BANDAGES/DRESSINGS) IMPLANT
BINDER BREAST XLRG (GAUZE/BANDAGES/DRESSINGS) ×2 IMPLANT
BNDG COHESIVE 4X5 TAN STRL (GAUZE/BANDAGES/DRESSINGS) ×2 IMPLANT
CANISTER SUCTION 2500CC (MISCELLANEOUS) ×2 IMPLANT
CHLORAPREP W/TINT 26ML (MISCELLANEOUS) ×2 IMPLANT
CLIP APPLIE 9.375 MED OPEN (MISCELLANEOUS) ×2 IMPLANT
CLOTH BEACON ORANGE TIMEOUT ST (SAFETY) ×2 IMPLANT
CLSR STERI-STRIP ANTIMIC 1/2X4 (GAUZE/BANDAGES/DRESSINGS) ×2 IMPLANT
COVER MAYO STAND STRL (DRAPES) ×2 IMPLANT
COVER SURGICAL LIGHT HANDLE (MISCELLANEOUS) ×2 IMPLANT
DERMABOND ADVANCED (GAUZE/BANDAGES/DRESSINGS) ×1
DERMABOND ADVANCED .7 DNX12 (GAUZE/BANDAGES/DRESSINGS) ×1 IMPLANT
DRAIN CHANNEL 19F RND (DRAIN) ×4 IMPLANT
DRAPE LAPAROSCOPIC ABDOMINAL (DRAPES) ×2 IMPLANT
DRAPE UTILITY 15X26 W/TAPE STR (DRAPE) ×4 IMPLANT
ELECT CAUTERY BLADE 6.4 (BLADE) ×2 IMPLANT
ELECT REM PT RETURN 9FT ADLT (ELECTROSURGICAL) ×2
ELECTRODE REM PT RTRN 9FT ADLT (ELECTROSURGICAL) ×1 IMPLANT
EVACUATOR SILICONE 100CC (DRAIN) ×4 IMPLANT
GAUZE SPONGE 4X4 16PLY XRAY LF (GAUZE/BANDAGES/DRESSINGS) ×2 IMPLANT
GLOVE BIO SURGEON STRL SZ8 (GLOVE) ×4 IMPLANT
GLOVE BIOGEL PI IND STRL 8 (GLOVE) ×2 IMPLANT
GLOVE BIOGEL PI INDICATOR 8 (GLOVE) ×2
GOWN STRL NON-REIN LRG LVL3 (GOWN DISPOSABLE) ×6 IMPLANT
KIT BASIN OR (CUSTOM PROCEDURE TRAY) ×2 IMPLANT
KIT ROOM TURNOVER OR (KITS) ×2 IMPLANT
NS IRRIG 1000ML POUR BTL (IV SOLUTION) ×2 IMPLANT
PACK GENERAL/GYN (CUSTOM PROCEDURE TRAY) ×2 IMPLANT
PAD ARMBOARD 7.5X6 YLW CONV (MISCELLANEOUS) ×4 IMPLANT
SPECIMEN JAR X LARGE (MISCELLANEOUS) ×2 IMPLANT
SPONGE GAUZE 4X4 12PLY (GAUZE/BANDAGES/DRESSINGS) ×2 IMPLANT
SPONGE INTESTINAL PEANUT (DISPOSABLE) ×2 IMPLANT
SPONGE LAP 18X18 X RAY DECT (DISPOSABLE) ×4 IMPLANT
STAPLER VISISTAT 35W (STAPLE) ×2 IMPLANT
STOCKINETTE IMPERVIOUS 9X36 MD (GAUZE/BANDAGES/DRESSINGS) ×2 IMPLANT
STRIP CLOSURE SKIN 1/2X4 (GAUZE/BANDAGES/DRESSINGS) ×4 IMPLANT
SUT ETHILON 3 0 FSL (SUTURE) ×4 IMPLANT
SUT MNCRL AB 4-0 PS2 18 (SUTURE) ×4 IMPLANT
SUT VIC AB 3-0 SH 18 (SUTURE) ×2 IMPLANT
SUT VIC AB 3-0 SH 8-18 (SUTURE) ×4 IMPLANT
TAPE CLOTH SURG 4X10 WHT LF (GAUZE/BANDAGES/DRESSINGS) ×2 IMPLANT
TAPE STRIPS DRAPE STRL (GAUZE/BANDAGES/DRESSINGS) ×2 IMPLANT
TOWEL OR 17X24 6PK STRL BLUE (TOWEL DISPOSABLE) ×2 IMPLANT
TOWEL OR 17X26 10 PK STRL BLUE (TOWEL DISPOSABLE) ×2 IMPLANT
TRAY FOLEY CATH 14FR (SET/KITS/TRAYS/PACK) ×2 IMPLANT
WATER STERILE IRR 1000ML POUR (IV SOLUTION) IMPLANT

## 2011-10-16 NOTE — H&P (Signed)
    Renee Serrano is an 56 y.o. female.  [id number] right breast cancer.  Has already received neoadjuvant treatments with a good response.  In need of her definitive surgery.    Past Medical History  Diagnosis Date  . DVT (deep venous thrombosis)   . Sinus drainage   . Complication of anesthesia     paniced first surg-had to sedate prior to surg  . Anxiety   . DVT (deep venous thrombosis) 2000    does not know why-takes asa  . Allergy   . Asthma   . Sleep apnea     does use cpap  . Breast cancer 05/2011    right breast ca  . Hyperlipidemia   . Restless leg syndrome     Past Surgical History  Procedure Date  . Functional endoscopic sinus surgery   . Colonoscopy   . Portacath placement 06/17/2011    Procedure: INSERTION PORT-A-CATH;  Surgeon: Rulon Abide, DO;  Location: [facility];  Service: General;  Laterality: Left;  left side mediport placement  . Breast biopsy 05/07/2011    right, ER/PR -, HER 2 -  . Abdominal hysterectomy 2004    Family History  Problem Relation Age of Onset  . Cancer Mother     colon  . Cancer Maternal Grandmother     breast   . Cancer Cousin     breast  . Cancer Cousin     breast  . Cancer Maternal Aunt     colon  . Cancer Maternal Aunt     breast     Social History:  reports that she has never smoked. She has never used smokeless tobacco. She reports that she does not drink alcohol or use illicit drugs.  Allergies:  Allergies  Allergen Reactions  . Penicillins Hives  . Hydrocodone Itching    Noted 3/13    Medications: I have reviewed the patient's current medications.  No results found for this or any previous visit (from the past 48 hour(s)).  No results found.  @ROS @ Blood pressure 149/82, pulse 75, temperature 98.3 F (36.8 C), temperature source Oral, resp. rate 18, SpO2 99.00%. General appearance: alert, cooperative and no distress Resp: clear to auscultation bilaterally Breasts: normal  appearance, no masses or tenderness, good response clinically, no mass or LAD Cardio: regular rate and rhythm, S1, S2 normal, no murmur, click, rub or gallop GI: soft, non-tender; bowel sounds normal; no masses,  no organomegaly  Assessment/Plan: Right breast cancer She had a suspicious node preop and I had offered her breast conservation and mastectomy and SLN bx.  She is interested in mastectomy and she also wants axillary dissection given enlarged node preop.  THis has been discussed extensively with oncology and rad onc as well and given her suspicious features preop, we are planning right MRM.  SHe understands that this does not change her treatment and that it will not affect her survival as well and that SLN is still an option but she would like to proceed with rt MRM.  Risks of infection, bleeding, recurrence, poor cosmesis, lymphedema, and nerve injury again discussed with her.  Will proceed with right MRM.  Lodema Pilot DAVID 10/16/2011, 8:07 AM

## 2011-10-16 NOTE — Brief Op Note (Signed)
10/16/2011  10:59 AM  PATIENT:  Garrel Ridgel  56 y.o. female  PRE-OPERATIVE DIAGNOSIS:  Right breast cancer  POST-OPERATIVE DIAGNOSIS:  Right breast cancer  PROCEDURE:  Procedure(s) (LRB): MASTECTOMY MODIFIED RADICAL (Right)  SURGEON:  Surgeon(s) and Role:    * Lodema Pilot, DO - Primary    * Thomas A. Cornett, MD  PHYSICIAN ASSISTANT:   ASSISTANTS: Cornett   ANESTHESIA:   general  EBL:  Total I/O In: 1000 [I.V.:1000] Out: 250 [Urine:250]  BLOOD ADMINISTERED:none  DRAINS: (3F x2) Jackson-Pratt drain(s) with closed bulb suction in the axilla, mastectomy flaps   LOCAL MEDICATIONS USED:  MARCAINE    and LIDOCAINE   SPECIMEN:  Source of Specimen:  right MRM, suture medial  DISPOSITION OF SPECIMEN:  PATHOLOGY  COUNTS:  YES  TOURNIQUET:  * No tourniquets in log *  DICTATION: .Other Dictation: Dictation Number [id number]  PLAN OF CARE: Admit for overnight observation  PATIENT DISPOSITION:  PACU - hemodynamically stable.   Delay start of Pharmacological VTE agent (>24hrs) due to surgical blood loss or risk of bleeding: no

## 2011-10-16 NOTE — Anesthesia Preprocedure Evaluation (Addendum)
Anesthesia Evaluation  Patient identified by MRN, date of birth, ID band Patient awake    Reviewed: Allergy & Precautions, H&P , NPO status , Patient's Chart, lab work & pertinent test results  Airway Mallampati: I TM Distance: >3 FB Neck ROM: Full    Dental  (+) Dental Advisory Given, Caps and Teeth Intact   Pulmonary asthma , sleep apnea and Continuous Positive Airway Pressure Ventilation ,  breath sounds clear to auscultation        Cardiovascular negative cardio ROS  Rhythm:Regular Rate:Normal     Neuro/Psych Anxiety    GI/Hepatic Neg liver ROS, GERD-  ,  Endo/Other  negative endocrine ROS  Renal/GU negative Renal ROS     Musculoskeletal negative musculoskeletal ROS (+)   Abdominal   Peds  Hematology negative hematology ROS (+)   Anesthesia Other Findings   Reproductive/Obstetrics                         Anesthesia Physical Anesthesia Plan  ASA: III  Anesthesia Plan: General   Post-op Pain Management:    Induction: Intravenous  Airway Management Planned: Oral ETT  Additional Equipment:   Intra-op Plan:   Post-operative Plan: Extubation in OR  Informed Consent: I have reviewed the patients History and Physical, chart, labs and discussed the procedure including the risks, benefits and alternatives for the proposed anesthesia with the patient or authorized representative who has indicated his/her understanding and acceptance.     Plan Discussed with: CRNA  Anesthesia Plan Comments:        Anesthesia Quick Evaluation

## 2011-10-16 NOTE — Anesthesia Postprocedure Evaluation (Signed)
  Anesthesia Post-op Note  Patient: Renee Serrano  Procedure(s) Performed: Procedure(s) (LRB): MASTECTOMY MODIFIED RADICAL (Right)  Patient Location: PACU  Anesthesia Type: General  Level of Consciousness: awake  Airway and Oxygen Therapy: Patient Spontanous Breathing  Post-op Pain: mild  Post-op Assessment: Post-op Vital signs reviewed  Post-op Vital Signs: Reviewed  Complications: No apparent anesthesia complications

## 2011-10-16 NOTE — Transfer of Care (Signed)
Immediate Anesthesia Transfer of Care Note  Patient: Renee Serrano  Procedure(s) Performed: Procedure(s) (LRB): MASTECTOMY MODIFIED RADICAL (Right)  Patient Location: PACU  Anesthesia Type: General  Level of Consciousness: awake, alert  and oriented  Airway & Oxygen Therapy: Patient Spontanous Breathing and Patient connected to face mask oxygen  Post-op Assessment: Report given to PACU RN, Post -op Vital signs reviewed and stable and Patient moving all extremities X 4  Post vital signs: Reviewed and stable  Complications: No apparent anesthesia complications

## 2011-10-17 LAB — BASIC METABOLIC PANEL
CO2: 26 mEq/L (ref 19–32)
Chloride: 104 mEq/L (ref 96–112)
Potassium: 3.9 mEq/L (ref 3.5–5.1)
Sodium: 140 mEq/L (ref 135–145)

## 2011-10-17 LAB — CBC
HCT: 27.5 % — ABNORMAL LOW (ref 36.0–46.0)
Hemoglobin: 8.9 g/dL — ABNORMAL LOW (ref 12.0–15.0)
MCV: 100 fL (ref 78.0–100.0)
Platelets: 223 10*3/uL (ref 150–400)
RBC: 2.75 MIL/uL — ABNORMAL LOW (ref 3.87–5.11)
WBC: 8 10*3/uL (ref 4.0–10.5)

## 2011-10-17 MED ORDER — OXYCODONE-ACETAMINOPHEN 5-325 MG PO TABS: 1.0000 | ORAL_TABLET | ORAL | Status: AC | PRN

## 2011-10-17 NOTE — Progress Notes (Signed)
1 Day Post-Op  Subjective: Doing well, no complaints.  Pain controlled  Objective: Vital signs in last 24 hours: Temp:  [97.9 F (36.6 C)-99.5 F (37.5 C)] 98.2 F (36.8 C) (05/09 0535) Pulse Rate:  [60-99] 67  (05/09 0535) Resp:  [14-18] 16  (05/09 0535) BP: (114-144)/(52-73) 114/59 mmHg (05/09 0535) SpO2:  [92 %-98 %] 92 % (05/09 0535) Last BM Date: 10/15/11  Intake/Output from previous day: 05/08 0701 - 05/09 0700 In: 4292.1 [P.O.:180; I.V.:4112.1] Out: 1500 [Urine:1150; Drains:150; Blood:200] Intake/Output this shift: Total I/O In: -  Out: 300 [Urine:300]  General appearance: alert, cooperative and no distress Resp: nonlabored Breasts: incision and flaps look good, drains thin Cardio: regular  Lab Results:   Basename 10/17/11 0555  WBC 8.0  HGB 8.9*  HCT 27.5*  PLT 223   BMET  Basename 10/17/11 0555  NA 140  K 3.9  CL 104  CO2 26  GLUCOSE 106*  BUN 10  CREATININE 0.54  CALCIUM 8.6   PT/INR No results found for this basename: LABPROT:2,INR:2 in the last 72 hours ABG No results found for this basename: PHART:2,PCO2:2,PO2:2,HCO3:2 in the last 72 hours  Studies/Results: No results found.  Anti-infectives: Anti-infectives    None      Assessment/Plan: s/p Procedure(s) (LRB): MASTECTOMY MODIFIED RADICAL (Right) she looks well.  pain controlled.  Her HGB was down so I will repeat HGB and if stable, she should be okay for discharge since there is no evidence of postoperative bleeding or hematoma.  LOS: 1 day    Lodema Pilot DAVID 10/17/2011

## 2011-10-17 NOTE — Discharge Summary (Signed)
Physician Discharge Summary  Patient ID: Renee Serrano MRN: [id number] DOB/AGE: [birth year] 56 y.o.  Admit date: 10/16/2011 Discharge date: 10/17/2011  Admission Diagnoses: right breast cancer  Discharge Diagnoses: same Active Problems:  * No active hospital problems. *    Discharged Condition: good  Hospital Course: to OR 10/16/11 for right modified radical mastectomy.  Kept overnight for observation and she did well and was stable for discharge on POD 1.  Consults: None  Significant Diagnostic Studies: none  Treatments: surgery: 10/16/11  Disposition: 01-Home or Self Care  Discharge Orders    Future Appointments: Provider: Department: Dept Phone: Center:   10/30/2011 3:00 PM Ellouise Newer, [facility] [phone] None   11/06/2011 11:30 AM [facility] [phone] None     Future Orders Please Complete By Expires   Diet - low sodium heart healthy      Increase activity slowly      Discharge instructions      Comments:   Strip and record drain output twice daily and call [phone] for appointment to remove drains when output less than 8ml/day for consecutive days. Sponge bathe around the wounds and drains. May change gauze as needed Leave steri strips in place until they fall off.   Call MD for:  temperature >100.4      Call MD for:  severe uncontrolled pain      Call MD for:  redness, tenderness, or signs of infection (pain, swelling, redness, odor or green/yellow discharge around incision site)      Call MD for:  difficulty breathing, headache or visual disturbances        Medication List  As of 10/17/2011  3:42 PM   STOP taking these medications         traMADol 50 MG tablet         TAKE these medications         aspirin 81 MG chewable tablet   Chew 81 mg by mouth daily.      cetirizine 10 MG tablet   Commonly known as: ZYRTEC   Take 10 mg by mouth daily.      citalopram 40 MG tablet   Commonly known as: CELEXA   Take 40 mg  by mouth daily.      docusate sodium 50 MG capsule   Commonly known as: COLACE   Take 200 mg by mouth 2 (two) times daily.      fluticasone 50 MCG/ACT nasal spray   Commonly known as: FLONASE   Place 2 sprays into the nose daily.      lidocaine-prilocaine cream   Commonly known as: EMLA   Apply 1 application topically as needed. For port access      LORazepam 1 MG tablet   Commonly known as: ATIVAN   Take 1 mg by mouth every 6 (six) hours as needed. For anxiety      magnesium hydroxide 400 MG/5ML suspension   Commonly known as: MILK OF MAGNESIA   Take 60 mLs by mouth at bedtime as needed. For constipation      montelukast 10 MG tablet   Commonly known as: SINGULAIR   Take 10 mg by mouth at bedtime.      multivitamin tablet   Take 1 tablet by mouth daily.      ondansetron 8 MG tablet   Commonly known as: ZOFRAN   Take 8 mg by mouth every 12 (twelve) hours as needed. For nausea  oxyCODONE-acetaminophen 5-325 MG per tablet   Commonly known as: PERCOCET   Take 1 tablet by mouth every 4 (four) hours as needed.      rOPINIRole 0.5 MG tablet   Commonly known as: REQUIP   Take 1 mg by mouth at bedtime.      simvastatin 40 MG tablet   Commonly known as: ZOCOR   Take 40 mg by mouth at bedtime.      vitamin C with rose hips 500 MG tablet   Take 500 mg by mouth daily.             SignedLodema Pilot DAVID 10/17/2011, 3:42 PM

## 2011-10-17 NOTE — Progress Notes (Signed)
Discharge home. Home discharge instruction given to patient and spouse, no question verbalized. JP teaching given to patient and spouse. Alert and oriented, not in any distress, ambulatory.

## 2011-10-17 NOTE — Op Note (Signed)
NAMEADISEN, BENNION NO.:  [id number]  MEDICAL RECORD NO.:  [id number]  LOCATION:  5128                         FACILITY:  [facility]  PHYSICIAN:  Lodema Pilot, MD       DATE OF BIRTH:  Sep 20, 1955  DATE OF PROCEDURE:  10/16/2011 DATE OF DISCHARGE:                              OPERATIVE REPORT   PROCEDURE:  Right modified radical mastectomy.  PREOPERATIVE DIAGNOSIS:  Right breast cancer.  POSTOPERATIVE DIAGNOSIS:  Right breast cancer.  SURGEON:  Lodema Pilot, MD  ASSISTANT:  Clovis Pu. Cornett, M.D.  ANESTHESIA:  General endotracheal anesthesia with 40 mL of 1% lidocaine with epinephrine, 0.25% Marcaine in a 50:50 mixture.  FLUIDS:  2 L of crystalloid.  ESTIMATED BLOOD LOSS:  Less than 200 mL.  SPECIMENS:  Right modified radical mastectomy with the suture mark in the medial breast.  DRAINS:  A 19-French Blake drain x2 with the lateral drain in the axilla and the medial drain under the mastectomy flaps.  COMPLICATIONS:  None apparent.  FINDINGS:  Pathologically enlarged axillary lymph nodes but otherwise no evidence of pectoralis involvement and no palpable breast mass.  INDICATION FOR PROCEDURE:  Ms. Selvy is a 56 year old female who was diagnosed in 2012 with a large right-sided breast cancer.  She underwent neoadjuvant chemotherapy, shrinked the mass, and she also had an abnormal appearing and enhancing right axillary lymph node, though she had a prior core biopsy of this which was negative.  Given the MRI findings and recommendations from several sources, she opted to proceed with modified radical mastectomy.  OPERATIVE DETAILS:  Ms. Stilley was seen and evaluated in the preoperative area, and risks and benefits of procedure were again discussed in lay terms.  Informed consent was obtained.  I again offered her sentinel node biopsy or breast conservation therapy, and she opted for modified radical mastectomy.  Surgical site was marked prior  to anesthetic administration and prophylactic antibiotics were given.  She was taken to the operating room, placed on the table in supine position and general endotracheal anesthesia was obtained and Foley catheter was placed.  The right arm and chest and breasts were prepped and draped in a standard surgical fashion and procedure time-out was performed with all operative team members to confirm proper patient and procedure. Then, an elliptical incision was made in the right breast to encompass the nipple-areolar complex, and the tailed incision was carried out to the axilla and dissection was carried down into the breast tissue and circumferential breast flaps were created in all 4 directions approximately 7 mm thick.  The left superior breast flap was created up to the clavicle medially to the sternum, inferiorly to the rectus abdominis muscle, and laterally to the edge of the pectoralis major muscle.  At this time, then we dissected up into the clavipectoral fascia and into the axillary contents.  The axillary vein was identified and bluntly dissected away the axillary fat from the chest wall medially.  The long thoracic nerve was identified and preserved, and this was left on the chest wall and the contents laterally were taken down.  Using multiple hemoclips, any lymphatic structures were clipped and divided.  As  I identified the thoracodorsal artery and vein and just medial to this was the thoracodorsal nerve.  We dissected superficial to this nerve until the nerve dove posteriorly, and we were able to further remove the axillary contents.  She did have some enlarged palpable nodes in the axilla.  These were all removed with the specimen.  The specimen was taken down laterally to the latissimus dorsi muscle, and the contents were amputated laterally.  A suture was placed on the medial skin of the specimen, and the specimen was sent to pathology for permanent section, labeled right  modified radical mastectomy.  There was no evidence of any pectoralis involvement and the pectoralis fascia was removed with the specimen.  The cavity was then irrigated with sterile saline solution until the irrigation returned clear.  The Endo balloon was noted be hemostatic.  The mastectomy flaps were inspected to ensure that they did not need to be thinned out, and the axillary contents appeared hemostatic as well.  The two 19-French Blake drains were placed in the wound and exited through a separate stab incision.  Lateral drain is the axillary drain and the medial drain is the mastectomy drain. They were sutured in place with nylon drain stitch, and the dermis was approximated with several interrupted 3-0 Vicryl sutures, and the skin edges were approximated with 4-0 Monocryl subcuticular suture.  Skin was washed and dried and benzoin Steri-Strips were applied, and sterile dressing was applied.  I filled the mastectomy cavity with 40 mL of 1% lidocaine with epinephrine and 0.25% Marcaine in a 50:50 mixture through the drain, and the drains were left half suction into the recovery room. The patient tolerated the procedure well, and the Foley catheter removed at the end of the case.  All sponge and instrument counts were correct at the end of the case.          ______________________________ Lodema Pilot, MD     BL/MEDQ  D:  10/16/2011  T:  10/16/2011  Job:  [id number]

## 2011-10-21 ENCOUNTER — Telehealth (INDEPENDENT_AMBULATORY_CARE_PROVIDER_SITE_OTHER): Payer: Self-pay | Admitting: General Surgery

## 2011-10-22 ENCOUNTER — Encounter: Payer: Self-pay | Admitting: *Deleted

## 2011-10-22 NOTE — Progress Notes (Signed)
[facility] Psychosocial Distress Screening Clinical Social Work  Clinical Social Work was referred by distress screening protocol.  The patient scored a 5 on the Psychosocial Distress Thermometer which indicates moderate distress. Clinical Social Worker spoke with patient on the phone to assess for distress and other psychosocial needs.  Pt is recovering from surgery, and stated she was doing "ok, but will be doing much better once I get these tubes out".  Pt expressed interest ing the support programs at [facility], and stated she planned to participate once she recovers from surgery.  CSW offered additional support and encouraged pt to call with any questions or concerns.  Pt was appreciative of contact.         Clinical Social Worker follow up needed:not at this time   Tamala Julian, MSW, LCSW Clinical Social Worker [facility] [phone]

## 2011-10-23 ENCOUNTER — Telehealth (INDEPENDENT_AMBULATORY_CARE_PROVIDER_SITE_OTHER): Payer: Self-pay | Admitting: General Surgery

## 2011-10-23 NOTE — Telephone Encounter (Signed)
Called Ms. Deahl to give pathology results, patient has follow up appointment with Dr. Biagio Quint 10/29/11 @ 4:30.

## 2011-10-23 NOTE — Telephone Encounter (Signed)
Pt calling with general questions about her post op course.  She is still having drainage from both her drains and will call if < 30 ml/day x2 if before her follow-up appt on 10/29/11.  She is also interested to hear the pathology results.  Please call her cell # [phone] when calling path.

## 2011-10-24 ENCOUNTER — Other Ambulatory Visit (HOSPITAL_COMMUNITY): Payer: BC Managed Care – PPO

## 2011-10-25 ENCOUNTER — Telehealth (INDEPENDENT_AMBULATORY_CARE_PROVIDER_SITE_OTHER): Payer: Self-pay | Admitting: General Surgery

## 2011-10-25 NOTE — Telephone Encounter (Signed)
Pt calling to report drain # 2 is draining <30 cc/day x 2 days, while drain # 1 is > 30 cc/day, but falling off as well.  She has appt on Tuesday, so informed her it would be removed then.  She understands.

## 2011-10-29 ENCOUNTER — Encounter (INDEPENDENT_AMBULATORY_CARE_PROVIDER_SITE_OTHER): Payer: Self-pay | Admitting: General Surgery

## 2011-10-29 ENCOUNTER — Ambulatory Visit (INDEPENDENT_AMBULATORY_CARE_PROVIDER_SITE_OTHER): Payer: BC Managed Care – PPO | Admitting: General Surgery

## 2011-10-29 VITALS — BP 124/80 | HR 74 | Temp 98.0°F | Resp 16 | Ht 66.0 in | Wt 210.0 lb

## 2011-10-29 DIAGNOSIS — Z4889 Encounter for other specified surgical aftercare: Secondary | ICD-10-CM

## 2011-10-29 DIAGNOSIS — Z5189 Encounter for other specified aftercare: Secondary | ICD-10-CM

## 2011-10-29 DIAGNOSIS — I972 Postmastectomy lymphedema syndrome: Secondary | ICD-10-CM

## 2011-10-29 NOTE — Progress Notes (Signed)
Subjective:     Patient ID: Renee Serrano, female   DOB: 07-Dec-1955, 56 y.o.   MRN: [id number]  HPI F/u s/p right modified radical mastectomy.  Pathology was favorable without any residual tumor identified and no lymph node involvement.  Her drain output is decreasing nicely and her lateral drain has minimal output, the mastectomy drain, still has about 31ml/day.  Her pain is minimal and she has no complaints other than some numbness on the lower and lateral chest.    Review of Systems     Objective:   Physical Exam Her incision is healing nicely and no sign of infection.  The flaps look good without any skin necrosis.  I removed her axillary drain.  She has some slight lymphedema but seems to be minimal.    Assessment:     S/p right MRM-doing well Her path was favorable and she seems to be doing well, without any evidence of postop complication.  She feels well.  She does appear to have a slight amount of right arm lymphedema but she is asymptomatic from this.  She is due to follow with her oncologist to review the pathology.  SHe is also going to follow up with her radiation oncologist to discuss adjuvant radiation.      Plan:     Follow up with medical and radiation oncologist as well as referral to lymphedema clinic.  I will see her back Friday to plan on removing her final drain.  Otherwise I will see her back in 3 months.

## 2011-10-30 ENCOUNTER — Encounter: Payer: Self-pay | Admitting: Radiation Oncology

## 2011-10-30 ENCOUNTER — Encounter (HOSPITAL_BASED_OUTPATIENT_CLINIC_OR_DEPARTMENT_OTHER): Payer: BC Managed Care – PPO | Admitting: Oncology

## 2011-10-30 VITALS — BP 141/80 | HR 78 | Temp 99.0°F | Wt 209.8 lb

## 2011-10-30 DIAGNOSIS — C50419 Malignant neoplasm of upper-outer quadrant of unspecified female breast: Secondary | ICD-10-CM

## 2011-10-30 NOTE — Patient Instructions (Signed)
[facility]  Discharge Instructions  RECOMMENDATIONS MADE BY THE CONSULTANT AND ANY TEST RESULTS WILL BE SENT TO YOUR REFERRING DOCTOR.   Return to clinic in 5 weeks to see Dr.Neijstrom. Call us with any issues or concerns that you may have during Radiation.   I acknowledge that I have been informed and understand all the instructions given to me and received a copy. I do not have any more questions at this time, but understand that I may call the Specialty Clinic at [facility] at [phone] during business hours should I have any further questions or need assistance in obtaining follow-up care.    __________________________________________  _____________  __________ Signature of Patient or Authorized Representative            Date                   Time    __________________________________________ Nurse's Signature

## 2011-10-30 NOTE — Progress Notes (Signed)
Zachery Dauer, MD, MD [address], [address] [place] [address]  1. Cancer of upper-outer quadrant of female breast     INTERVAL HISTORY: Renee Serrano 56 y.o. 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  Status post 4 cycles of Cytoxan and Adriamycin and now S/P 4 cycle of Taxol. S/P Right modified radical mastectomy with no evidence of malignancy and 0/10 nodes for disease. She will now pursue radiation.   [facility] is her for follow-up.  Her mastectomy went very well without any issues.  Her pathology reveals no evidence of malignancy and 0/10 nodes.  She of course is please with this.  She asks if she still needs to pursue radiation and she does.  She has an appointment tomorrow with Dr. Dayton Scrape.  She denies any complaints today.  Dr. Biagio Quint is performing post-surgical care.   Oncologically, she denies any complaints.    Past Medical History  Diagnosis Date  . Sinus drainage   . Anxiety   . DVT (deep venous thrombosis) 2000    does not know why-takes asa; LLL  . Allergy   . Asthma   . Hyperlipidemia   . Restless leg syndrome   . Complication of anesthesia     paniced first surg-had to sedate prior to surg  . Sleep apnea     does use cpap  . Migraines     "hx; taken care of w/sinus OR"  . Breast cancer 05/2011    right    has Cancer of upper-outer quadrant of female breast and Breast cancer on her problem list.     is allergic to penicillins and hydrocodone.  Ms. Schneiderman does not currently have medications on file.  Past Surgical History  Procedure Date  . Functional endoscopic sinus surgery ~ 1997  . Colonoscopy   . Portacath placement 06/17/2011    Procedure: INSERTION PORT-A-CATH;  Surgeon: Rulon Abide, DO;  Location: [facility];  Service: General;   Laterality: Left;  left side mediport placement  . Mastectomy modified radical w/ axillary lymph nodes w/ or w/o pectoralis minor 10/16/11    right, no residual ca, 10/10 nodes neg  . Breast biopsy 05/07/2011    right- infiltrating ductal ca, ER/PR -, HER 2 -  . Vaginal hysterectomy 2004    Denies any headaches, dizziness, double vision, fevers, chills, night sweats, nausea, vomiting, diarrhea, constipation, chest pain, heart palpitations, shortness of breath, blood in stool, black tarry stool, urinary pain, urinary burning, urinary frequency, hematuria.   PHYSICAL EXAMINATION  ECOG PERFORMANCE STATUS: 1 - Symptomatic but completely ambulatory  Filed Vitals:   10/30/11 1511  BP: 141/80  Pulse: 78  Temp: 99 F (37.2 C)    GENERAL:alert, no distress, well nourished, well developed, comfortable, cooperative and smiling SKIN: skin color, texture, turgor are normal, no rashes or significant lesions HEAD: Normocephalic, No masses, lesions, tenderness or abnormalities EYES: normal, Conjunctiva are pink and non-injected EARS: External ears normal OROPHARYNX:lips, buccal mucosa, and tongue normal and mucous membranes are moist  NECK: supple, trachea midline LYMPH:  not examined BREAST:not examined LUNGS: clear to auscultation and percussion HEART: regular rate & rhythm, no murmurs, no gallops, S1 normal and S2 normal ABDOMEN:abdomen soft and normal bowel sounds BACK: Back symmetric, no curvature., No CVA tenderness EXTREMITIES:less then 2  second capillary refill, no joint deformities, effusion, or inflammation, no edema, no skin discoloration, no clubbing, no cyanosis  NEURO: alert & oriented x 3 with fluent speech, no focal motor/sensory deficits, gait normal   PATHOLOGY:  10/16/2011  Diagnosis Breast, modified radical mastectomy , Right - NO RESIDUAL CARCINOMA IDENTIFIED. SEE COMMENT. - SURGICAL RESECTION MARGINS, NEGATIVE FOR ATYPIA OR MALIGNANCY. - TEN LYMPH NODES, NEGATIVE FOR  TUMOR (0/10). -SKIN/NIPPLE, NEGATIVE FOR TUMOR Microscopic Comment The entire parenchymal fibrotic area was submitted along with multiple-sections from tissue surrounding breast parenchyma. There is no residual tumor identified in any of the sections by routine histology or with multiple cytokeratin AE1/AE3 immunostains. In addition, no intranodal metastatic epithelial tumor deposits were identified in any of the lymph nodes with multiple Cytokeratin AE1/3 immunostains. Non-neoplastic breast findings include neoadjuvant related change and fibrocystic change. There is a minute (less than 0.1 mm) nodal melanocytic rest identified (slide 1P). The pathologic stage is pTX, pN0, pMX. (CR:kh 10-21-11) [place] RUND DO Pathologist, Electronic Signature (Case signed 10/21/2011)   ASSESSMENT: 1. 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  Status post 4 cycles of Cytoxan and Adriamycin and now S/P 4 cycle of Taxol. S/P Right modified radical mastectomy with no evidence of malignancy and 0/10 nodes for disease. She will now pursue radiation.    PLAN:  1. Will see Dr. Dayton Scrape tomorrow for radiation oncology consultation and determining when to start radiation.  2. Return in 4-5 weeks for follow-up.   All questions were answered. The patient knows to call the clinic with any problems, questions or concerns. We can certainly see the patient much sooner if necessary.  The patient and plan discussed with Glenford Peers, MD and he is in agreement with the aforementioned.  Pinkie Manger

## 2011-10-31 ENCOUNTER — Ambulatory Visit
Admission: RE | Admit: 2011-10-31 | Discharge: 2011-10-31 | Disposition: A | Discharge status: Home / Self Care | Payer: BC Managed Care – PPO | Source: Ambulatory Visit | Attending: Radiation Oncology | Admitting: Radiation Oncology

## 2011-10-31 ENCOUNTER — Encounter: Payer: Self-pay | Admitting: Radiation Oncology

## 2011-10-31 ENCOUNTER — Other Ambulatory Visit (INDEPENDENT_AMBULATORY_CARE_PROVIDER_SITE_OTHER): Payer: Self-pay

## 2011-10-31 VITALS — BP 136/83 | HR 68 | Temp 98.3°F | Resp 20 | Wt 210.0 lb

## 2011-10-31 DIAGNOSIS — C50919 Malignant neoplasm of unspecified site of unspecified female breast: Secondary | ICD-10-CM

## 2011-10-31 DIAGNOSIS — C50419 Malignant neoplasm of upper-outer quadrant of unspecified female breast: Secondary | ICD-10-CM

## 2011-10-31 DIAGNOSIS — I890 Lymphedema, not elsewhere classified: Secondary | ICD-10-CM

## 2011-10-31 NOTE — Progress Notes (Signed)
Followup note:  Clinical stage II A (T2 N0 M0) versus stage III A (T3, N1, M0), (pathologic stage TX NX M0) invasive ductal carcinoma of the right breast  Renee Serrano returns today following her modified radical mastectomy in the management of her invasive ductal carcinoma the right breast. I last saw her for a followup visit on April 9 at which time we discussed her axillary lymph node management based on her initial MRI scan suggesting axillary lymph node involvement with a needle core biopsy containing lymphoid tissue but no evidence for metastatic carcinoma. Please see followup note dated 09/17/2011 for details of her presentation and neoadjuvant chemotherapy with Dr. Mariel Sleet. Dr. Biagio Quint agreed to a right modified radical mastectomy (including a full right axillary node dissection). She had surgery on 10/16/2011 and was found to have a complete response with no evidence for residual carcinoma within the breast and a total of 10 lymph nodes negative for malignancy. She is doing well postoperatively. She is here today to discuss the possibility of adjuvant post mastectomy radiation therapy.  Physical examination: Not performed.  Impression: Clinical stage II A (T2 N0 M0) versus stage III A (T3, N1, M0)/pathologic stage TX NX M0 invasive ductal carcinoma of the right breast. The patient presentation was quite consistent with her having locally advanced disease with MRI showing at least a 4 cm mass within the right breast along with a satellite nodule abutting or involving the right pectoralis musculature and abnormal enhancement spanning 10 cm, perhaps representing DCIS (although no DCIS seen on final pathology) along with suspicious axillary findings consistent with axillary lymphadenopathy. I felt that the needle biopsy of the axillary lymph node was a false negative, and felt that she should have a completion axillary node dissection at the time of her mastectomy. She may have had at best T2 N0  disease, or at  worst T3 N1 disease and having gone on to have a complete response to her chemotherapy. The role of radiation therapy in this setting with a complete response is unsettled and prospective randomized trials are opening to answer this question. M.D. [facility] as recommended post mastectomy radiation therapy in patients with clinical stage III disease while others have tried to develop a risk assessment based on both clinical and pathologic stages based on retrospective data. In my opinion, there are inconclusive data to make a firm recommendation regarding the role of adjuvant post mastectomy radiation therapy in this setting. There are those feel that the risk for locoregional recurrence would perhaps be as low as 10% and others  He feel that it may be greater than 15-20% in this setting. One has to consider the potential acute and late toxicities of radiation therapy including the risk for lymphedema,  loss of skin elasticity affecting choice of right breast reconstruction (avoid implant) and rare risk for pulmonary toxicity. I would be okay if the patient wanted to avoid post mastectomy radiation therapy recognizing that we have inconclusive data at this time, she would have to feel comfortable with this decision.  Plan: She lives in [place], and she could have radiation therapy in [place], and thus I would like for her to have a second opinion with Dr. Michell Heinrich who can proceed with post mastectomy radiation therapy if the patient so desires. I will tentatively set her up for a followup visit with simulation to follow in early June.  45 minutes was spent face-to-face with the patient, primarily counseling the patient and coordinating her care.  Plan:

## 2011-10-31 NOTE — Progress Notes (Signed)
Please see the Nurse Progress Note in the MD Initial Consult Encounter for this patient. 

## 2011-10-31 NOTE — Progress Notes (Signed)
Pt has 1 drain in place r breast w/small amt clear drainage. She has appt w/Dr Biagio Quint tomorrow. Pt has post op soreness R breast, axilla, denies pain.

## 2011-11-01 ENCOUNTER — Encounter (INDEPENDENT_AMBULATORY_CARE_PROVIDER_SITE_OTHER): Payer: Self-pay | Admitting: General Surgery

## 2011-11-01 ENCOUNTER — Ambulatory Visit (INDEPENDENT_AMBULATORY_CARE_PROVIDER_SITE_OTHER): Payer: BC Managed Care – PPO | Admitting: General Surgery

## 2011-11-01 VITALS — BP 142/88 | HR 78 | Temp 97.3°F | Resp 14 | Ht 66.0 in | Wt 208.8 lb

## 2011-11-01 DIAGNOSIS — Z4889 Encounter for other specified surgical aftercare: Secondary | ICD-10-CM

## 2011-11-01 DIAGNOSIS — Z5189 Encounter for other specified aftercare: Secondary | ICD-10-CM

## 2011-11-01 NOTE — Progress Notes (Signed)
Subjective:     Patient ID: Renee Serrano, female   DOB: Feb 24, 1956, 56 y.o.   MRN: [id number]  HPI Here for possible drain removal.  No new complaints.  40ml and 38ml over last 2 days.  Review of Systems     Objective:   Physical Exam SS output, 20ml in drain now.  Wound okay     Assessment:     S/p right MRM Still with a little more drainage than I would like prior to removal.  I have recommended that we leave the drain for another few days     Plan:     She will call us back for removal when output less than 54ml/day

## 2011-11-01 NOTE — Progress Notes (Signed)
Encounter addended by: Glennie Hawk, RN on: 11/01/2011  9:42 AM<BR>     Documentation filed: Charges VN

## 2011-11-06 ENCOUNTER — Encounter (HOSPITAL_BASED_OUTPATIENT_CLINIC_OR_DEPARTMENT_OTHER): Payer: BC Managed Care – PPO

## 2011-11-06 DIAGNOSIS — C50419 Malignant neoplasm of upper-outer quadrant of unspecified female breast: Secondary | ICD-10-CM

## 2011-11-06 DIAGNOSIS — Z452 Encounter for adjustment and management of vascular access device: Secondary | ICD-10-CM

## 2011-11-06 MED ORDER — SODIUM CHLORIDE 0.9 % IJ SOLN
10.0000 mL | INTRAMUSCULAR | Status: DC | PRN
  Administered 2011-11-06: 10 mL via INTRAVENOUS
  Filled 2011-11-06: qty 10

## 2011-11-06 MED ORDER — HEPARIN SOD (PORK) LOCK FLUSH 100 UNIT/ML IV SOLN
500.0000 [IU] | Freq: Once | INTRAVENOUS | Status: AC
  Administered 2011-11-06: 500 [IU] via INTRAVENOUS
  Filled 2011-11-06: qty 5

## 2011-11-06 MED ORDER — SODIUM CHLORIDE 0.9 % IJ SOLN
INTRAMUSCULAR | Status: AC
  Filled 2011-11-06: qty 10

## 2011-11-06 MED ORDER — HEPARIN SOD (PORK) LOCK FLUSH 100 UNIT/ML IV SOLN
INTRAVENOUS | Status: AC
  Filled 2011-11-06: qty 5

## 2011-11-08 ENCOUNTER — Telehealth (INDEPENDENT_AMBULATORY_CARE_PROVIDER_SITE_OTHER): Payer: Self-pay | Admitting: General Surgery

## 2011-11-08 NOTE — Telephone Encounter (Signed)
Pt calling to report she is still having 25-60 cc/day of drainage.  Reassured pt and she will call back when < 30 cc/day x 2days.  She understands and will comply.

## 2011-11-11 ENCOUNTER — Ambulatory Visit (INDEPENDENT_AMBULATORY_CARE_PROVIDER_SITE_OTHER): Payer: BC Managed Care – PPO | Admitting: General Surgery

## 2011-11-11 ENCOUNTER — Telehealth (INDEPENDENT_AMBULATORY_CARE_PROVIDER_SITE_OTHER): Payer: Self-pay | Admitting: General Surgery

## 2011-11-11 DIAGNOSIS — C50919 Malignant neoplasm of unspecified site of unspecified female breast: Secondary | ICD-10-CM

## 2011-11-11 NOTE — Progress Notes (Signed)
The patient enters the clinic concerned with her output in her drains, she is still producing 30ccs and above expressed to her that the area would need to have the drain in until it is under 30ccs daily. Patient concerned the area is red. No redness except where tape covers the drain, expressed to the patient she may want to try a new tape.

## 2011-11-11 NOTE — Telephone Encounter (Signed)
Pt calling to schedule herself in for drains to be removed.  According to Dr. Delice Lesch notes, waiting to have drainage < 30 ml/ 2 days.   Surgery was 10/16/11.  Yesterday was 30 ml and today was 15 mg.  Scheduled a Nurse Only visit to remove them.

## 2011-11-14 ENCOUNTER — Ambulatory Visit (INDEPENDENT_AMBULATORY_CARE_PROVIDER_SITE_OTHER): Payer: BC Managed Care – PPO | Admitting: General Surgery

## 2011-11-14 VITALS — BP 108/68 | Temp 98.2°F | Resp 18

## 2011-11-14 DIAGNOSIS — Z09 Encounter for follow-up examination after completed treatment for conditions other than malignant neoplasm: Secondary | ICD-10-CM

## 2011-11-15 ENCOUNTER — Ambulatory Visit (INDEPENDENT_AMBULATORY_CARE_PROVIDER_SITE_OTHER): Payer: BC Managed Care – PPO | Admitting: Surgery

## 2011-11-15 ENCOUNTER — Encounter (INDEPENDENT_AMBULATORY_CARE_PROVIDER_SITE_OTHER): Payer: Self-pay | Admitting: Surgery

## 2011-11-15 VITALS — BP 122/90 | HR 108 | Temp 100.4°F | Resp 18 | Ht 66.0 in | Wt 207.6 lb

## 2011-11-15 DIAGNOSIS — C50419 Malignant neoplasm of upper-outer quadrant of unspecified female breast: Secondary | ICD-10-CM

## 2011-11-15 MED ORDER — DOXYCYCLINE HYCLATE 100 MG PO TABS: 100.0000 mg | ORAL_TABLET | Freq: Two times a day (BID) | ORAL | Status: AC

## 2011-11-15 NOTE — Progress Notes (Signed)
Nurse Visit - 11/14/11  Patient was seen for drain removal on 11/14/11, patient reports she has had less than 25 cc's in her JP drain.  Patient reported her drain output has decreased, at the time of patient nurse visit BP: 108/68, T: 98.2, R: 18.  Drain removed and gauze placed over drainage site. Patient has follow up appointment w/Dr. Lina Sayre 11/20/11.

## 2011-11-15 NOTE — Progress Notes (Signed)
Renee Serrano 56 y.o.  Body mass index is 33.51 kg/(m^2).  Patient Active Problem List  Diagnoses  . Cancer of upper-outer quadrant of female breast  . Breast cancer    Allergies  Allergen Reactions  . Penicillins Hives  . Hydrocodone Itching    Noted 3/13    Past Surgical History  Procedure Date  . Functional endoscopic sinus surgery ~ 1997  . Colonoscopy   . Portacath placement 06/17/2011    Procedure: INSERTION PORT-A-CATH;  Surgeon: Rulon Abide, DO;  Location: [facility];  Service: General;  Laterality: Left;  left side mediport placement  . Mastectomy modified radical w/ axillary lymph nodes w/ or w/o pectoralis minor 10/16/11    right, no residual ca, 10/10 nodes neg  . Breast biopsy 05/07/2011    right- infiltrating ductal ca, ER/PR -, HER 2 -  . Vaginal hysterectomy 2004    has ovaries   Renee Serrano,Renee T, MD, MD No diagnosis found.  Comes to Urgent Office with achy, pain in breast.  Maybe low grade fever.  Had drained pulled yesterday.  No erythema Will treat with Doxycycline 100 BID x 7 days.  Followup with Dr. Biagio Quint next week Matt B. Daphine Deutscher, MD, [facility] [phone] beeper [phone]  11/15/2011 4:27 PM

## 2011-11-15 NOTE — Progress Notes (Signed)
Addended by: Luretha Murphy B on: 11/15/2011 04:33 PM   Modules accepted: Orders

## 2011-11-15 NOTE — Patient Instructions (Signed)
Warm compresses and observation of breast

## 2011-11-18 ENCOUNTER — Telehealth (INDEPENDENT_AMBULATORY_CARE_PROVIDER_SITE_OTHER): Payer: Self-pay | Admitting: General Surgery

## 2011-11-18 NOTE — Telephone Encounter (Signed)
Pt calling with update:  She was seen by Dr. Daphine Deutscher (urg on Friday) with fever and started on Doxycycline BID.  She reports she is still running low-grade fevers of 99.5 F.  Site of drainage tube is still leaking and she is applying warm compresses, as instructed.  Encouraged her to continue antibx, antipyretics and push po fluids AMAP.  She has appt with Dr. Biagio Quint on  Wednesday.

## 2011-11-20 ENCOUNTER — Ambulatory Visit (INDEPENDENT_AMBULATORY_CARE_PROVIDER_SITE_OTHER): Payer: BC Managed Care – PPO | Admitting: General Surgery

## 2011-11-20 ENCOUNTER — Ambulatory Visit
Admission: RE | Admit: 2011-11-20 | Discharge: 2011-11-20 | Disposition: A | Discharge status: Home / Self Care | Payer: BC Managed Care – PPO | Source: Ambulatory Visit | Attending: General Surgery | Admitting: General Surgery

## 2011-11-20 ENCOUNTER — Encounter (INDEPENDENT_AMBULATORY_CARE_PROVIDER_SITE_OTHER): Payer: Self-pay | Admitting: General Surgery

## 2011-11-20 VITALS — BP 124/78 | HR 74 | Temp 97.4°F | Resp 14 | Ht 66.0 in | Wt 211.2 lb

## 2011-11-20 DIAGNOSIS — Z5189 Encounter for other specified aftercare: Secondary | ICD-10-CM

## 2011-11-20 DIAGNOSIS — IMO0002 Reserved for concepts with insufficient information to code with codable children: Secondary | ICD-10-CM

## 2011-11-20 DIAGNOSIS — Z4889 Encounter for other specified surgical aftercare: Secondary | ICD-10-CM

## 2011-11-20 MED ORDER — IOHEXOL 350 MG/ML SOLN
125.0000 mL | Freq: Once | INTRAVENOUS | Status: AC | PRN
  Administered 2011-11-20: 125 mL via INTRAVENOUS

## 2011-11-20 NOTE — Progress Notes (Signed)
Subjective:     Patient ID: Renee Serrano, female   DOB: December 06, 1955, 56 y.o.   MRN: [id number]  HPI This patient is status post right modified radical mastectomy.. She recently had her drains removed is since then says that she has "not been feeling good". She says that Thursday she had a fever of 101 and has had some low-grade temperatures since then as well. She was seen by my partner for evaluation and thought to have a seroma and was started on doxycycline which she started 5 days ago. She says her pain is improved but she still does not feel well and has some shortness of breath and heavy her breathing. She denies any chest pain. Review of Systems     Objective:   Physical Exam Her incision is appears to be healing well without any sign of infection but she does have some fullness and puffiness and some apparent fluid wave at the inferior aspect of her breast and both lateral and medial portions. There is no cellulitis or erythema or sign of infection.    Assessment:     Postoperative seroma and shortness of breath Given her history of malignancy and recent surgery as well as the new onset of shortness of breath and difficulty breathing I recommended a CT angiogram to evaluate for possible embolus. This will also give [place] any indication of any fluid collection under her mastectomy flaps.    Plan:     Since the patient was seen and before I could do this dictation, I have already heard back from the radiologist and reviewed her images.  There is no evidence of pulmonary embolus but she does have a seroma and fluid collection under her mastectomy flaps. If this becomes more symptomatic or sinus infections then we will consider drainage. Otherwise this should improve slowly.

## 2011-11-22 ENCOUNTER — Ambulatory Visit: Payer: BC Managed Care – PPO | Admitting: Physical Therapy

## 2011-12-02 ENCOUNTER — Ambulatory Visit: Payer: BC Managed Care – PPO | Attending: General Surgery | Admitting: Physical Therapy

## 2011-12-02 ENCOUNTER — Encounter (HOSPITAL_COMMUNITY): Payer: BC Managed Care – PPO | Attending: Oncology | Admitting: Oncology

## 2011-12-02 VITALS — BP 129/78 | HR 78 | Temp 98.6°F

## 2011-12-02 DIAGNOSIS — C50419 Malignant neoplasm of upper-outer quadrant of unspecified female breast: Secondary | ICD-10-CM

## 2011-12-02 DIAGNOSIS — IMO0001 Reserved for inherently not codable concepts without codable children: Secondary | ICD-10-CM | POA: Insufficient documentation

## 2011-12-02 DIAGNOSIS — I890 Lymphedema, not elsewhere classified: Secondary | ICD-10-CM | POA: Insufficient documentation

## 2011-12-02 NOTE — Progress Notes (Signed)
Renee Serrano is a work in today for redness of her right arm seen by her lymphedema specialist today. She has no fever no chills and no pain. It is minimally pain approximately 10 cm above the elbow and for a few centimeters below medially more than anywhere else it is not hot nor is it bright red by any means she states that it has gone down in color just since being seen this morning. So we will watch her since I do not think it's cellulitis at this stage by any means and we'll see her Wednesday and Friday and she knows to watch out for fever chills etc.

## 2011-12-02 NOTE — Progress Notes (Signed)
Came from lymphedema clinic and was told right arm was pink mid upper arm down to wrist area.  Denies any discomfort and arm does not appear pink at present.  VS stable BP 129/78 HR 78 T 98.6.

## 2011-12-04 ENCOUNTER — Encounter (HOSPITAL_COMMUNITY): Payer: BC Managed Care – PPO | Admitting: Oncology

## 2011-12-04 VITALS — BP 148/91 | HR 70 | Temp 98.7°F | Ht 66.0 in | Wt 208.6 lb

## 2011-12-04 DIAGNOSIS — C50419 Malignant neoplasm of upper-outer quadrant of unspecified female breast: Secondary | ICD-10-CM

## 2011-12-04 NOTE — Progress Notes (Signed)
Renee Serrano is here today for [place] to quickly look at her arm which was erythematous the other day.  Today the patient right medial arm is erythematous inferior and superior to the antecubital area. It is not warm to the touch when compared to the other arm. It is pink in color. It is no larger or more erythematous than it was the other day. The patient denies any fevers or chills. She feels well.  Therefore we'll see the patient as scheduled on Friday for followup. She reports that the radiation machine was down today and therefore should not receive radiation as scheduled today. There'll be no charge for today's visit.  Kayona Foor

## 2011-12-06 ENCOUNTER — Encounter (HOSPITAL_BASED_OUTPATIENT_CLINIC_OR_DEPARTMENT_OTHER): Payer: BC Managed Care – PPO | Admitting: Oncology

## 2011-12-06 VITALS — BP 134/90 | HR 76 | Temp 98.1°F

## 2011-12-06 DIAGNOSIS — R609 Edema, unspecified: Secondary | ICD-10-CM

## 2011-12-06 DIAGNOSIS — C50419 Malignant neoplasm of upper-outer quadrant of unspecified female breast: Secondary | ICD-10-CM

## 2011-12-06 LAB — CBC
HCT: 37.6 % (ref 36.0–46.0)
Hemoglobin: 12.3 g/dL (ref 12.0–15.0)
MCHC: 32.7 g/dL (ref 30.0–36.0)
RBC: 4.07 MIL/uL (ref 3.87–5.11)
WBC: 4.3 10*3/uL (ref 4.0–10.5)

## 2011-12-06 LAB — DIFFERENTIAL
Lymphocytes Relative: 26 % (ref 12–46)
Lymphs Abs: 1.1 10*3/uL (ref 0.7–4.0)
Monocytes Absolute: 0.3 10*3/uL (ref 0.1–1.0)
Monocytes Relative: 8 % (ref 3–12)
Neutro Abs: 2.6 10*3/uL (ref 1.7–7.7)
Neutrophils Relative %: 60 % (ref 43–77)

## 2011-12-06 MED ORDER — LORAZEPAM 1 MG PO TABS: 1.0000 mg | ORAL_TABLET | Freq: Four times a day (QID) | ORAL | Status: DC | PRN

## 2011-12-06 NOTE — Patient Instructions (Addendum)
Renee Serrano  [id number] 11/03/1955 Dr. Glenford Peers   [facility]  Discharge Instructions  RECOMMENDATIONS MADE BY THE CONSULTANT AND ANY TEST RESULTS WILL BE SENT TO YOUR REFERRING DOCTOR.   EXAM FINDINGS BY MD TODAY AND SIGNS AND SYMPTOMS TO REPORT TO CLINIC OR PRIMARY MD: exam and discussion per MD.Right arm slightly reddened.  If arm becomes more reddened, becomes painful or you start running fever let us know.  We will check labs today to make sure your blood counts are ok for your dental work.  MEDICATIONS PRESCRIBED: none   INSTRUCTIONS GIVEN AND DISCUSSED: Report any new lumps, bone pain or shortness of breath.   SPECIAL INSTRUCTIONS/FOLLOW-UP: Lab work Needed today  and Return to Clinic in 3 months for follow-up.   I acknowledge that I have been informed and understand all the instructions given to me and received a copy. I do not have any more questions at this time, but understand that I may call the Specialty Clinic at [facility] at [phone] during business hours should I have any further questions or need assistance in obtaining follow-up care.    __________________________________________  _____________  __________ Signature of Patient or Authorized Representative            Date                   Time    __________________________________________ Nurse's Signature

## 2011-12-06 NOTE — Progress Notes (Signed)
Problem #1 right sided triple negative breast cancer clinically 5 x 7 cm though on MRI was 4 cm possibly larger. By MRI and CT scan she had abnormal lymph nodes in the axilla and a lesion along the chest wall but lymph node biopsy which was extremely small was negative for tumor now status post dose dense AC. followed by Taxol. Both the [facility] and Taxol were dose dense she is having minimal redness (pinkness) of the right arm above and below the elbow crease for several centimeters and we have watched her all we. I personally do not think anything has progressed nor has it changed. There is no increased temperature or tenderness and she has no fever systemically. I suspect the pain this is from the edema. She will watch this and will take her temperature if need be and let us know if anything changes. She feels good overall is slightly concerned about a thickened area below the scar on the right chest wall. That is her only positive review of systems today.  She states she is sleeping better with the use of lorazepam which she can continue.  Vital signs are stable the right chest wall shows postoperative changes but nothing suggestive of recurrent disease. The area medially below the scar is normal in my opinion for this type of postoperative appearance. She has no nodularity. She has no adenopathy. The left breast is negative. Port-A-Cath is intact. Her lungs are clear. Heart shows a regular rhythm and rate without murmur rub or gallop. Abdomen is soft and nontender without organomegaly. She has no leg edema. She clearly has edema the right arm both upper and lower segments of the arm and slight puffiness of the hand  We'll see her in 3 months if all is well then we'll go to six-month followups. She is to have routine mammography on the left breast every year.

## 2011-12-06 NOTE — Progress Notes (Signed)
Renee Serrano presented for labwork. Labs per MD order drawn via Peripheral Line 23 gauge needle inserted in left AC  Good blood return present. Procedure without incident.  Needle removed intact. Patient tolerated procedure well.

## 2011-12-06 NOTE — Addendum Note (Signed)
Addended by: Evelena Leyden on: 12/06/2011 03:02 PM   Modules accepted: Orders

## 2011-12-06 NOTE — Addendum Note (Signed)
Addended by: Evelena Leyden on: 12/06/2011 12:15 PM   Modules accepted: Orders

## 2011-12-09 ENCOUNTER — Ambulatory Visit: Payer: BC Managed Care – PPO | Attending: Family Medicine

## 2011-12-09 DIAGNOSIS — I890 Lymphedema, not elsewhere classified: Secondary | ICD-10-CM | POA: Insufficient documentation

## 2011-12-09 DIAGNOSIS — IMO0001 Reserved for inherently not codable concepts without codable children: Secondary | ICD-10-CM | POA: Insufficient documentation

## 2011-12-16 ENCOUNTER — Ambulatory Visit: Payer: BC Managed Care – PPO | Admitting: Physical Therapy

## 2011-12-18 ENCOUNTER — Encounter (HOSPITAL_COMMUNITY): Payer: BC Managed Care – PPO | Attending: Oncology

## 2011-12-18 DIAGNOSIS — Z452 Encounter for adjustment and management of vascular access device: Secondary | ICD-10-CM

## 2011-12-18 DIAGNOSIS — C50919 Malignant neoplasm of unspecified site of unspecified female breast: Secondary | ICD-10-CM | POA: Insufficient documentation

## 2011-12-18 MED ORDER — SODIUM CHLORIDE 0.9 % IJ SOLN
INTRAMUSCULAR | Status: AC
  Filled 2011-12-18: qty 10

## 2011-12-18 MED ORDER — HEPARIN SOD (PORK) LOCK FLUSH 100 UNIT/ML IV SOLN
INTRAVENOUS | Status: AC
  Filled 2011-12-18: qty 5

## 2011-12-18 MED ORDER — HEPARIN SOD (PORK) LOCK FLUSH 100 UNIT/ML IV SOLN
500.0000 [IU] | Freq: Once | INTRAVENOUS | Status: AC
  Administered 2011-12-18: 500 [IU] via INTRAVENOUS
  Filled 2011-12-18: qty 5

## 2011-12-18 MED ORDER — SODIUM CHLORIDE 0.9 % IJ SOLN
10.0000 mL | Freq: Once | INTRAMUSCULAR | Status: AC
  Administered 2011-12-18: 10 mL via INTRAVENOUS
  Filled 2011-12-18: qty 10

## 2011-12-18 NOTE — Progress Notes (Signed)
Renee Serrano presented for Portacath access and flush. Proper placement of portacath confirmed by CXR. Portacath located left chest wall accessed with  H 20 needle. Good blood return present. Portacath flushed with 20ml NS and 500U/5ml Heparin and needle removed intact. Procedure without incident. Patient tolerated procedure well.   

## 2011-12-19 ENCOUNTER — Ambulatory Visit: Payer: BC Managed Care – PPO | Admitting: Physical Therapy

## 2012-01-22 ENCOUNTER — Encounter (INDEPENDENT_AMBULATORY_CARE_PROVIDER_SITE_OTHER): Payer: BC Managed Care – PPO | Admitting: General Surgery

## 2012-01-24 ENCOUNTER — Encounter (INDEPENDENT_AMBULATORY_CARE_PROVIDER_SITE_OTHER): Payer: BC Managed Care – PPO | Admitting: General Surgery

## 2012-01-29 ENCOUNTER — Telehealth (HOSPITAL_COMMUNITY): Payer: Self-pay | Admitting: *Deleted

## 2012-01-29 ENCOUNTER — Encounter (HOSPITAL_COMMUNITY): Payer: BC Managed Care – PPO | Attending: Oncology

## 2012-01-29 ENCOUNTER — Encounter (HOSPITAL_COMMUNITY): Payer: BC Managed Care – PPO

## 2012-01-29 DIAGNOSIS — Z452 Encounter for adjustment and management of vascular access device: Secondary | ICD-10-CM

## 2012-01-29 DIAGNOSIS — C50419 Malignant neoplasm of upper-outer quadrant of unspecified female breast: Secondary | ICD-10-CM

## 2012-01-29 DIAGNOSIS — C50919 Malignant neoplasm of unspecified site of unspecified female breast: Secondary | ICD-10-CM | POA: Insufficient documentation

## 2012-01-29 MED ORDER — HEPARIN SOD (PORK) LOCK FLUSH 100 UNIT/ML IV SOLN
500.0000 [IU] | Freq: Once | INTRAVENOUS | Status: AC
  Administered 2012-01-29: 500 [IU] via INTRAVENOUS
  Filled 2012-01-29: qty 5

## 2012-01-29 MED ORDER — SODIUM CHLORIDE 0.9 % IJ SOLN
INTRAMUSCULAR | Status: AC
  Filled 2012-01-29: qty 10

## 2012-01-29 MED ORDER — HEPARIN SOD (PORK) LOCK FLUSH 100 UNIT/ML IV SOLN
INTRAVENOUS | Status: AC
  Filled 2012-01-29: qty 5

## 2012-01-29 MED ORDER — SODIUM CHLORIDE 0.9 % IJ SOLN
10.0000 mL | INTRAMUSCULAR | Status: DC | PRN
  Administered 2012-01-29: 10 mL via INTRAVENOUS
  Filled 2012-01-29: qty 10

## 2012-01-29 NOTE — Progress Notes (Signed)
Tolerated port flush well. 

## 2012-01-29 NOTE — Telephone Encounter (Signed)
Chart review for office visit

## 2012-02-05 ENCOUNTER — Other Ambulatory Visit (HOSPITAL_COMMUNITY): Payer: Self-pay | Admitting: Oncology

## 2012-02-05 ENCOUNTER — Ambulatory Visit (INDEPENDENT_AMBULATORY_CARE_PROVIDER_SITE_OTHER): Payer: BC Managed Care – PPO | Admitting: General Surgery

## 2012-02-05 VITALS — BP 102/70 | HR 74 | Temp 97.4°F | Resp 16 | Ht 66.0 in | Wt 212.8 lb

## 2012-02-05 DIAGNOSIS — Z853 Personal history of malignant neoplasm of breast: Secondary | ICD-10-CM

## 2012-02-05 NOTE — Progress Notes (Signed)
Subjective:     Patient ID: Renee Serrano, female   DOB: 17-Nov-1955, 56 y.o.   MRN: [id number]  HPI This patient follows up status post right modified radical mastectomy. She underwent neoadjuvant chemotherapy and she also underwent postoperative adjuvant radiation. She had a rough time with the radiation therapy and had significant skin desquamation and her treatments were cut short. Since then, she also has had a slight increase in her lymphedema as well. Otherwise, she seems to be doing well and has no complaints.  Review of Systems     Objective:   Physical Exam  Right mastectomy incision well healed without any suspicious nodules or evidence of recurrence.  She has some erythema of the skin in the radiated field but no evidence of infection.  Her left breast is without suspicious masses, skin changes or lymphadenopathy.      Assessment:     Personal history of breast cancer She is doing okay after her surgery. There is no evidence of any postoperative complications. I think that she is fairly relieved to be completed now with all of her therapies. She has had a rough time dealing with the lymphedema of and with the skin changes from her radiation. I do not see any evidence of recurrence on exam and I again emphasized the need for monthly self breast exam to evaluate for recurrence.    Plan:     I will see her back in 6 months for repeat exam and this will be after her mammograms. Whenever medical oncology is done with her port, we will be happy to schedule removal.

## 2012-02-08 ENCOUNTER — Other Ambulatory Visit (HOSPITAL_COMMUNITY): Payer: Self-pay | Admitting: Oncology

## 2012-03-03 ENCOUNTER — Encounter (HOSPITAL_COMMUNITY): Payer: BC Managed Care – PPO

## 2012-03-03 ENCOUNTER — Encounter (HOSPITAL_COMMUNITY): Payer: Self-pay | Admitting: Oncology

## 2012-03-03 ENCOUNTER — Encounter (HOSPITAL_COMMUNITY): Payer: BC Managed Care – PPO | Attending: Oncology | Admitting: Oncology

## 2012-03-03 VITALS — BP 165/85 | HR 84 | Temp 98.5°F | Resp 18 | Wt 215.2 lb

## 2012-03-03 DIAGNOSIS — I998 Other disorder of circulatory system: Secondary | ICD-10-CM | POA: Insufficient documentation

## 2012-03-03 DIAGNOSIS — C50919 Malignant neoplasm of unspecified site of unspecified female breast: Secondary | ICD-10-CM | POA: Insufficient documentation

## 2012-03-03 DIAGNOSIS — J309 Allergic rhinitis, unspecified: Secondary | ICD-10-CM | POA: Insufficient documentation

## 2012-03-03 DIAGNOSIS — I878 Other specified disorders of veins: Secondary | ICD-10-CM

## 2012-03-03 DIAGNOSIS — M533 Sacrococcygeal disorders, not elsewhere classified: Secondary | ICD-10-CM

## 2012-03-03 DIAGNOSIS — C50419 Malignant neoplasm of upper-outer quadrant of unspecified female breast: Secondary | ICD-10-CM

## 2012-03-03 LAB — COMPREHENSIVE METABOLIC PANEL
ALT: 27 U/L (ref 0–35)
AST: 26 U/L (ref 0–37)
Albumin: 3.9 g/dL (ref 3.5–5.2)
CO2: 28 mEq/L (ref 19–32)
Calcium: 10.3 mg/dL (ref 8.4–10.5)
Creatinine, Ser: 0.68 mg/dL (ref 0.50–1.10)
GFR calc non Af Amer: >90 mL/min (ref >90)
Sodium: 139 mEq/L (ref 135–145)
Total Protein: 7.3 g/dL (ref 6.0–8.3)

## 2012-03-03 LAB — CBC WITH DIFFERENTIAL/PLATELET
Basophils Absolute: 0 10*3/uL (ref 0.0–0.1)
Basophils Relative: 0 % (ref 0–1)
Eosinophils Absolute: 0.4 10*3/uL (ref 0.0–0.7)
Eosinophils Relative: 5 % (ref 0–5)
HCT: 37.4 % (ref 36.0–46.0)
Lymphocytes Relative: 19 % (ref 12–46)
MCHC: 33.4 g/dL (ref 30.0–36.0)
MCV: 90.8 fL (ref 78.0–100.0)
Monocytes Absolute: 0.5 10*3/uL (ref 0.1–1.0)
Platelets: 274 10*3/uL (ref 150–400)
RDW: 14.1 % (ref 11.5–15.5)
WBC: 7.2 10*3/uL (ref 4.0–10.5)

## 2012-03-03 MED ORDER — SODIUM CHLORIDE 0.9 % IJ SOLN
INTRAMUSCULAR | Status: AC
  Filled 2012-03-03: qty 10

## 2012-03-03 MED ORDER — SODIUM CHLORIDE 0.9 % IJ SOLN
10.0000 mL | INTRAMUSCULAR | Status: DC | PRN
  Administered 2012-03-03: 10 mL via INTRAVENOUS
  Filled 2012-03-03: qty 10

## 2012-03-03 MED ORDER — HEPARIN SOD (PORK) LOCK FLUSH 100 UNIT/ML IV SOLN
INTRAVENOUS | Status: AC
  Filled 2012-03-03: qty 5

## 2012-03-03 MED ORDER — HEPARIN SOD (PORK) LOCK FLUSH 100 UNIT/ML IV SOLN
500.0000 [IU] | Freq: Once | INTRAVENOUS | Status: AC
  Administered 2012-03-03: 500 [IU] via INTRAVENOUS
  Filled 2012-03-03: qty 5

## 2012-03-03 MED ORDER — FLUTICASONE PROPIONATE 50 MCG/ACT NA SUSP: 2.0000 | Freq: Every day | NASAL | Status: DC

## 2012-03-03 MED ORDER — LORAZEPAM 1 MG PO TABS: 1.0000 mg | ORAL_TABLET | Freq: Three times a day (TID) | ORAL | Status: DC | PRN

## 2012-03-03 NOTE — Patient Instructions (Addendum)
[facility]  Discharge Instructions  RECOMMENDATIONS MADE BY THE CONSULTANT AND ANY TEST RESULTS WILL BE SENT TO YOUR REFERRING DOCTOR.   EXAM FINDINGS BY MD TODAY AND SIGNS AND SYMPTOMS TO REPORT TO CLINIC OR PRIMARY MD: exam and discussion per MD.  MEDICATIONS PRESCRIBED: Flonase nasal spray as directed. Refill for Ativan - you can take it for sleep as needed Follow label directions  INSTRUCTIONS GIVEN AND DISCUSSED: Other : Report any new lumps, bone pain or shortness of breath.   SPECIAL INSTRUCTIONS/FOLLOW-UP: Lab work Needed today, Xray Studies Needed next week for Bone Scan and Return to Clinic for port flushes every 6 weeks (if you get your port removed, cancel appointment and in 6 months for follow-up.   I acknowledge that I have been informed and understand all the instructions given to me and received a copy. I do not have any more questions at this time, but understand that I may call the Specialty Clinic at [facility] at [phone] during business hours should I have any further questions or need assistance in obtaining follow-up care.    __________________________________________  _____________  __________ Signature of Patient or Authorized Representative            Date                   Time    __________________________________________ Nurse's Signature

## 2012-03-03 NOTE — Progress Notes (Signed)
Problem #1 right sided triple negative breast cancer clinically 5 x 7 cm. On MRI was 4 cm possibly larger. She had on both CT scan and MRI scan abnormal lymph nodes in the axilla and a lesion along the chest wall very worrisome for disease but the lymph node biopsy which was a very small biopsy however was negative. She took dose dense chemotherapy with Adriamycin and Cytoxan followed by dose dense Taxol. Both were for 4 cycles. She then went on to radiation therapy after her mastectomy and lymph node dissection which essentially showed no residual disease in the breast or lymph nodes in that date of surgery was 10/16/2011. She then went on to radiation as I mentioned in this finish that recently. 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 She is anxious at times especially when she is about to come to see [place]. She has gained a little bit more weight unfortunately but work on that. She's also having allergic rhinitis symptomatology and a scratchy throat or throat looks fine. I would not culture that today. I will refill her Flonase nasal spray. If she is not better she will let me know and we can culture it then. She does not have a fever today no face pain and she does not cough up Green phlegm.  She is aware of some pain in the very very low portion of the back right at the sacrum/coccygeal area. She wants her Port-A-Cath removed but I do want to do blood work and a bone scan before  this Port-A-Cath is removed just in case since she's had the pain in the bone area she states of the low back since she finished radiation therapy and she thinks is slightly worse. It is definitely worse when she sits on a hard bench at church or at school. Her oncologic review of systems otherwise is negative. She is a little concerned about discomfort in the upper portion of left breast but there is nothing to palpate she states. Vital signs recorded. Lymph nodes are negative throughout. She also thought there was  little thickening in the scar which there is think is just normal part of the scar in the right axilla. The right chest wall is clear without nodularity but there is some thickening of the tissues especially below the scar where there is some heaped up tissue from just a surgical procedure. The left breast is negative for any masses. Her lungs are clear. Heart shows a very soft grade 1/6 systolic ejection murmur. No S3 gallop. Abdomen is obese but without hepatosplenomegaly bowel sounds are normal. She has no peripheral edema other than the right arm is slightly edematous and she is wearing her sleeve etc.  See her tentatively in 6 months. We'll set up a mammogram in early November since she's not eligible have it done until then according to her insurance company. We will do some blood work and a bone scan to play it safe and if they are fine she can have her Port-A-Cath removed from my standpoint.

## 2012-03-03 NOTE — Progress Notes (Signed)
Renee Serrano presented for Portacath access and flush. Proper placement of portacath confirmed by CXR. Portacath located left chest wall accessed with  H 20 needle. Good blood return present. Portacath flushed with 20ml NS and 500U/46ml Heparin and needle removed intact. Procedure without incident. Patient tolerated procedure well.

## 2012-03-11 ENCOUNTER — Encounter (HOSPITAL_COMMUNITY): Payer: Self-pay

## 2012-03-11 ENCOUNTER — Ambulatory Visit (HOSPITAL_COMMUNITY)
Admission: RE | Admit: 2012-03-11 | Discharge: 2012-03-11 | Disposition: A | Discharge status: Home / Self Care | Payer: BC Managed Care – PPO | Source: Ambulatory Visit | Attending: Oncology | Admitting: Oncology

## 2012-03-11 DIAGNOSIS — C50919 Malignant neoplasm of unspecified site of unspecified female breast: Secondary | ICD-10-CM

## 2012-03-11 DIAGNOSIS — M545 Low back pain, unspecified: Secondary | ICD-10-CM | POA: Insufficient documentation

## 2012-03-11 HISTORY — DX: Essential (primary) hypertension: I10

## 2012-03-11 MED ORDER — TECHNETIUM TC 99M MEDRONATE IV KIT
25.0000 | PACK | Freq: Once | INTRAVENOUS | Status: AC | PRN
  Administered 2012-03-11: 24 via INTRAVENOUS

## 2012-03-20 ENCOUNTER — Other Ambulatory Visit (INDEPENDENT_AMBULATORY_CARE_PROVIDER_SITE_OTHER): Payer: Self-pay | Admitting: General Surgery

## 2012-03-31 ENCOUNTER — Encounter (HOSPITAL_COMMUNITY): Payer: Self-pay | Admitting: *Deleted

## 2012-04-08 ENCOUNTER — Encounter (HOSPITAL_COMMUNITY): Payer: Self-pay | Admitting: Pharmacy Technician

## 2012-04-10 ENCOUNTER — Encounter (HOSPITAL_COMMUNITY)
Admission: RE | Disposition: A | Discharge status: Home / Self Care | Payer: Self-pay | Source: Ambulatory Visit | Attending: General Surgery

## 2012-04-10 ENCOUNTER — Encounter (HOSPITAL_COMMUNITY): Payer: Self-pay | Admitting: Certified Registered Nurse Anesthetist

## 2012-04-10 ENCOUNTER — Ambulatory Visit (HOSPITAL_COMMUNITY): Payer: BC Managed Care – PPO | Admitting: Certified Registered Nurse Anesthetist

## 2012-04-10 ENCOUNTER — Ambulatory Visit (HOSPITAL_COMMUNITY)
Admission: RE | Admit: 2012-04-10 | Discharge: 2012-04-10 | Disposition: A | Discharge status: Home / Self Care | Payer: BC Managed Care – PPO | Source: Ambulatory Visit | Attending: General Surgery | Admitting: General Surgery

## 2012-04-10 ENCOUNTER — Encounter (HOSPITAL_COMMUNITY): Payer: Self-pay | Admitting: *Deleted

## 2012-04-10 ENCOUNTER — Telehealth (INDEPENDENT_AMBULATORY_CARE_PROVIDER_SITE_OTHER): Payer: Self-pay | Admitting: General Surgery

## 2012-04-10 DIAGNOSIS — G4730 Sleep apnea, unspecified: Secondary | ICD-10-CM | POA: Insufficient documentation

## 2012-04-10 DIAGNOSIS — Z853 Personal history of malignant neoplasm of breast: Secondary | ICD-10-CM | POA: Insufficient documentation

## 2012-04-10 DIAGNOSIS — Z452 Encounter for adjustment and management of vascular access device: Secondary | ICD-10-CM | POA: Insufficient documentation

## 2012-04-10 DIAGNOSIS — C50919 Malignant neoplasm of unspecified site of unspecified female breast: Secondary | ICD-10-CM

## 2012-04-10 DIAGNOSIS — I10 Essential (primary) hypertension: Secondary | ICD-10-CM | POA: Insufficient documentation

## 2012-04-10 DIAGNOSIS — Z86718 Personal history of other venous thrombosis and embolism: Secondary | ICD-10-CM | POA: Insufficient documentation

## 2012-04-10 DIAGNOSIS — E785 Hyperlipidemia, unspecified: Secondary | ICD-10-CM | POA: Insufficient documentation

## 2012-04-10 HISTORY — PX: PORT-A-CATH REMOVAL: SHX5289

## 2012-04-10 LAB — CBC
HCT: 36 % (ref 36.0–46.0)
MCV: 90.2 fL (ref 78.0–100.0)
Platelets: 262 10*3/uL (ref 150–400)
RBC: 3.99 MIL/uL (ref 3.87–5.11)
WBC: 5.7 10*3/uL (ref 4.0–10.5)

## 2012-04-10 LAB — SURGICAL PCR SCREEN: Staphylococcus aureus: POSITIVE — AB

## 2012-04-10 SURGERY — REMOVAL PORT-A-CATH
Anesthesia: Monitor Anesthesia Care | Wound class: Clean

## 2012-04-10 MED ORDER — PROPOFOL 10 MG/ML IV EMUL
INTRAVENOUS | Status: DC | PRN
  Administered 2012-04-10: 100 ug/kg/min via INTRAVENOUS

## 2012-04-10 MED ORDER — LIDOCAINE HCL 1 % IJ SOLN
INTRAMUSCULAR | Status: AC
  Filled 2012-04-10: qty 20

## 2012-04-10 MED ORDER — LIDOCAINE HCL (CARDIAC) 20 MG/ML IV SOLN
INTRAVENOUS | Status: DC | PRN
  Administered 2012-04-10: 70 mg via INTRAVENOUS

## 2012-04-10 MED ORDER — 0.9 % SODIUM CHLORIDE (POUR BTL) OPTIME
TOPICAL | Status: DC | PRN
  Administered 2012-04-10: 1000 mL

## 2012-04-10 MED ORDER — ACETAMINOPHEN 10 MG/ML IV SOLN
INTRAVENOUS | Status: DC | PRN
  Administered 2012-04-10: 1000 mg via INTRAVENOUS

## 2012-04-10 MED ORDER — CLINDAMYCIN PHOSPHATE 600 MG/50ML IV SOLN
600.0000 mg | Freq: Once | INTRAVENOUS | Status: AC
  Administered 2012-04-10: 600 mg via INTRAVENOUS

## 2012-04-10 MED ORDER — MUPIROCIN 2 % EX OINT: TOPICAL_OINTMENT | Freq: Two times a day (BID) | CUTANEOUS | Status: DC

## 2012-04-10 MED ORDER — ACETAMINOPHEN 10 MG/ML IV SOLN
INTRAVENOUS | Status: AC
  Filled 2012-04-10: qty 100

## 2012-04-10 MED ORDER — MIDAZOLAM HCL 5 MG/5ML IJ SOLN
INTRAMUSCULAR | Status: DC | PRN
  Administered 2012-04-10: 2 mg via INTRAVENOUS

## 2012-04-10 MED ORDER — OXYCODONE HCL 5 MG PO TABS: 5.0000 mg | ORAL_TABLET | ORAL | Status: DC | PRN

## 2012-04-10 MED ORDER — MUPIROCIN 2 % EX OINT
TOPICAL_OINTMENT | Freq: Two times a day (BID) | CUTANEOUS | Status: DC
  Filled 2012-04-10: qty 22

## 2012-04-10 MED ORDER — ONDANSETRON HCL 4 MG/2ML IJ SOLN
INTRAMUSCULAR | Status: DC | PRN
  Administered 2012-04-10: 4 mg via INTRAVENOUS

## 2012-04-10 MED ORDER — BUPIVACAINE-EPINEPHRINE 0.25% -1:200000 IJ SOLN
INTRAMUSCULAR | Status: DC | PRN
  Administered 2012-04-10: 30 mL

## 2012-04-10 MED ORDER — OXYCODONE HCL 5 MG PO TABS
5.0000 mg | ORAL_TABLET | Freq: Once | ORAL | Status: AC
  Administered 2012-04-10: 5 mg via ORAL
  Filled 2012-04-10: qty 1

## 2012-04-10 MED ORDER — KETAMINE HCL 10 MG/ML IJ SOLN
INTRAMUSCULAR | Status: DC | PRN
  Administered 2012-04-10: 20 mg via INTRAVENOUS

## 2012-04-10 MED ORDER — LACTATED RINGERS IV SOLN
INTRAVENOUS | Status: DC | PRN
  Administered 2012-04-10: 07:00:00 via INTRAVENOUS

## 2012-04-10 MED ORDER — BUPIVACAINE-EPINEPHRINE PF 0.25-1:200000 % IJ SOLN
INTRAMUSCULAR | Status: AC
  Filled 2012-04-10: qty 30

## 2012-04-10 MED ORDER — CLINDAMYCIN PHOSPHATE 600 MG/50ML IV SOLN
600.0000 mg | INTRAVENOUS | Status: DC
  Filled 2012-04-10: qty 50

## 2012-04-10 MED ORDER — FENTANYL CITRATE 0.05 MG/ML IJ SOLN
INTRAMUSCULAR | Status: DC | PRN
  Administered 2012-04-10 (×3): 50 ug via INTRAVENOUS

## 2012-04-10 MED ORDER — LIDOCAINE HCL (PF) 1 % IJ SOLN
INTRAMUSCULAR | Status: DC | PRN
  Administered 2012-04-10: 30 mL

## 2012-04-10 SURGICAL SUPPLY — 32 items
BLADE HEX COATED 2.75 (ELECTRODE) ×2 IMPLANT
BLADE SURG 15 STRL LF DISP TIS (BLADE) ×1 IMPLANT
BLADE SURG 15 STRL SS (BLADE) ×1
CANISTER SUCTION 2500CC (MISCELLANEOUS) ×2 IMPLANT
CLOTH BEACON ORANGE TIMEOUT ST (SAFETY) ×2 IMPLANT
DECANTER SPIKE VIAL GLASS SM (MISCELLANEOUS) IMPLANT
DERMABOND ADVANCED (GAUZE/BANDAGES/DRESSINGS) ×1
DERMABOND ADVANCED .7 DNX12 (GAUZE/BANDAGES/DRESSINGS) ×1 IMPLANT
DRAPE LAPAROTOMY TRNSV 102X78 (DRAPE) ×2 IMPLANT
ELECT REM PT RETURN 9FT ADLT (ELECTROSURGICAL) ×2
ELECTRODE REM PT RTRN 9FT ADLT (ELECTROSURGICAL) ×1 IMPLANT
GLOVE BIO SURGEON STRL SZ7 (GLOVE) ×4 IMPLANT
GLOVE BIOGEL PI IND STRL 7.0 (GLOVE) ×1 IMPLANT
GLOVE BIOGEL PI INDICATOR 7.0 (GLOVE) ×1
GLOVE SURG SS PI 7.5 STRL IVOR (GLOVE) ×4 IMPLANT
GOWN PREVENTION PLUS LG XLONG (DISPOSABLE) ×2 IMPLANT
GOWN STRL NON-REIN LRG LVL3 (GOWN DISPOSABLE) ×2 IMPLANT
GOWN STRL REIN XL XLG (GOWN DISPOSABLE) ×4 IMPLANT
KIT BASIN OR (CUSTOM PROCEDURE TRAY) ×2 IMPLANT
NEEDLE HYPO 22GX1.5 SAFETY (NEEDLE) IMPLANT
NEEDLE HYPO 25X1 1.5 SAFETY (NEEDLE) ×2 IMPLANT
PACK BASIC VI WITH GOWN DISP (CUSTOM PROCEDURE TRAY) ×2 IMPLANT
PENCIL BUTTON HOLSTER BLD 10FT (ELECTRODE) ×2 IMPLANT
SPONGE GAUZE 4X4 12PLY (GAUZE/BANDAGES/DRESSINGS) IMPLANT
SPONGE LAP 4X18 X RAY DECT (DISPOSABLE) ×2 IMPLANT
SUT MNCRL AB 4-0 PS2 18 (SUTURE) ×2 IMPLANT
SUT VIC AB 3-0 SH 27 (SUTURE) ×1
SUT VIC AB 3-0 SH 27XBRD (SUTURE) ×1 IMPLANT
SYR BULB IRRIGATION 50ML (SYRINGE) IMPLANT
SYR CONTROL 10ML LL (SYRINGE) ×2 IMPLANT
TOWEL OR 17X26 10 PK STRL BLUE (TOWEL DISPOSABLE) ×2 IMPLANT
YANKAUER SUCT BULB TIP 10FT TU (MISCELLANEOUS) ×2 IMPLANT

## 2012-04-10 NOTE — H&P (Signed)
Renee Serrano is an 56 y.o. female.  HPI: known to me for triple negative right breast cancer.  S/p right MRM.  Has completed radiation and chemo and is ready for port removal.  Dr. Mariel Sleet has cleared her for removal.  She is doing SBE and has no suspicious masses.    Past Medical History  Diagnosis Date  . Sinus drainage   . Anxiety   . DVT (deep venous thrombosis) 2000    does not know why-takes asa; LLL  . Allergy   . Asthma   . Hyperlipidemia   . Restless leg syndrome   . Complication of anesthesia     paniced first surg-had to sedate prior to surg  . Sleep apnea     does use cpap  . Migraines     "hx; taken care of w/sinus OR"  . Breast cancer 05/2011    right  . Hypertension     Past Surgical History  Procedure Date  . Functional endoscopic sinus surgery ~ 1997  . Colonoscopy   . Portacath placement 06/17/2011    Procedure: INSERTION PORT-A-CATH;  Surgeon: Rulon Abide, DO;  Location: [facility];  Service: General;  Laterality: Left;  left side mediport placement  . Mastectomy modified radical w/ axillary lymph nodes w/ or w/o pectoralis minor 10/16/11    right, no residual ca, 10/10 nodes neg  . Breast biopsy 05/07/2011    right- infiltrating ductal ca, ER/PR -, HER 2 -  . Vaginal hysterectomy 2004    has ovaries    Family History  Problem Relation Age of Onset  . Cancer Mother     colon  . Cancer Maternal Grandmother     breast   . Cancer Cousin     breast  . Cancer Cousin     breast  . Cancer Maternal Aunt     colon  . Cancer Maternal Aunt     breast     Social History:  reports that she has never smoked. She has never used smokeless tobacco. She reports that she does not drink alcohol or use illicit drugs.  Allergies:  Allergies  Allergen Reactions  . Penicillins Hives  . Hydrocodone Itching    Noted 3/13    Medications: I have reviewed the patient's current medications.  Results for orders placed during the hospital  encounter of 04/10/12 (from the past 48 hour(s))  CBC     Status: Normal   Collection Time   04/10/12  6:00 AM      Component Value Range Comment   WBC 5.7  4.0 - 10.5 K/uL    RBC 3.99  3.87 - 5.11 MIL/uL    Hemoglobin 12.3  12.0 - 15.0 g/dL    HCT 16.1  09.6 - 04.5 %    MCV 90.2  78.0 - 100.0 fL    MCH 30.8  26.0 - 34.0 pg    MCHC 34.2  30.0 - 36.0 g/dL    RDW 40.9  81.1 - 91.4 %    Platelets 262  150 - 400 K/uL     No results found.  @ROS @ Blood pressure 138/75, pulse 72, temperature 98.2 F (36.8 C), resp. rate 20, height 5\' 6"  (1.676 m), weight 214 lb (97.07 kg), SpO2 95.00%. General appearance: alert, cooperative and no distress Resp: clear to auscultation bilaterally Breasts: right mastectomy scar without evidence of recurrence, left breast normal.  left subclavian mediport Cardio: normal rate, regular GI: soft, non-tender; bowel  sounds normal; no masses,  no organomegaly Extremities: slight right UE edema  Assessment/Plan: History of right breast cancer Doing well.  Plan for mediport removal today.  She is due for mammogram next week.  I recommended that she wait until this is done to ensure no other lesions prior to removal.  She expressed understanding of reasons why and said that she would rather have to have it replaced if necessary but she does not want to wait.  We discussed the risks of infection, bleeding, pain, scarring, need for replacement and she expressed understanding and desires to proceed with left mediport removal.  Renee Serrano 04/10/2012, 7:26 AM

## 2012-04-10 NOTE — Anesthesia Preprocedure Evaluation (Signed)
Anesthesia Evaluation  Patient identified by MRN, date of birth, ID band Patient awake    Reviewed: Allergy & Precautions, H&P , NPO status , Patient's Chart, lab work & pertinent test results  History of Anesthesia Complications Negative for: history of anesthetic complications  Airway Mallampati: II TM Distance: >3 FB Neck ROM: Full    Dental No notable dental hx.    Pulmonary neg pulmonary ROS, asthma , sleep apnea ,  breath sounds clear to auscultation  Pulmonary exam normal       Cardiovascular hypertension, Pt. on medications DVT negative cardio ROS  Rhythm:Regular Rate:Normal     Neuro/Psych negative neurological ROS  negative psych ROS   GI/Hepatic negative GI ROS, Neg liver ROS,   Endo/Other  negative endocrine ROS  Renal/GU negative Renal ROS  negative genitourinary   Musculoskeletal negative musculoskeletal ROS (+)   Abdominal   Peds negative pediatric ROS (+)  Hematology negative hematology ROS (+)   Anesthesia Other Findings   Reproductive/Obstetrics negative OB ROS                           Anesthesia Physical Anesthesia Plan  ASA: II  Anesthesia Plan: General   Post-op Pain Management:    Induction: Intravenous  Airway Management Planned:   Additional Equipment:   Intra-op Plan:   Post-operative Plan: Extubation in OR  Informed Consent: I have reviewed the patients History and Physical, chart, labs and discussed the procedure including the risks, benefits and alternatives for the proposed anesthesia with the patient or authorized representative who has indicated his/her understanding and acceptance.   Dental advisory given  Plan Discussed with: CRNA  Anesthesia Plan Comments:         Anesthesia Quick Evaluation

## 2012-04-10 NOTE — Preoperative (Signed)
Beta Blockers   Reason not to administer Beta Blockers:Not Applicable 

## 2012-04-10 NOTE — Telephone Encounter (Signed)
Called and left message for patient to return the call to obtain post op appointment information.  Patient set up to see Dr. Biagio Quint on 11/19 at 1:15.

## 2012-04-10 NOTE — Brief Op Note (Signed)
04/10/2012  8:27 AM  PATIENT:  Renee Serrano  56 y.o. female  PRE-OPERATIVE DIAGNOSIS:  breast cancer  POST-OPERATIVE DIAGNOSIS:  breast cancer  PROCEDURE:  Procedure(s) (LRB) with comments: REMOVAL PORT-A-CATH (N/A)  SURGEON:  Surgeon(s) and Role:    * Lodema Pilot, DO - Primary  PHYSICIAN ASSISTANT:   ASSISTANTS: none   ANESTHESIA:   IV sedation  EBL:  Total I/O In: 600 [I.V.:600] Out: -   BLOOD ADMINISTERED:none  DRAINS: none   LOCAL MEDICATIONS USED:  MARCAINE    and LIDOCAINE   SPECIMEN:  Source of Specimen:  port removed not sent to path  DISPOSITION OF SPECIMEN:  N/A  COUNTS:  YES  TOURNIQUET:  * No tourniquets in log *  DICTATION: .Other Dictation: Dictation Number [id number]  PLAN OF CARE: Discharge to home after PACU  PATIENT DISPOSITION:  PACU - hemodynamically stable.   Delay start of Pharmacological VTE agent (>24hrs) due to surgical blood loss or risk of bleeding: no

## 2012-04-10 NOTE — Progress Notes (Signed)
Pt instructed she is positive for Staph and to continue to use Mupirocin Ointment in nose for a total of 10 doses 2x a day

## 2012-04-10 NOTE — Anesthesia Postprocedure Evaluation (Signed)
  Anesthesia Post-op Note  Patient: Renee Serrano  Procedure(s) Performed: Procedure(s) (LRB): REMOVAL PORT-A-CATH (N/A)  Patient Location: PACU  Anesthesia Type: MAC  Level of Consciousness: awake and alert   Airway and Oxygen Therapy: Patient Spontanous Breathing  Post-op Pain: mild  Post-op Assessment: Post-op Vital signs reviewed, Patient's Cardiovascular Status Stable, Respiratory Function Stable, Patent Airway and No signs of Nausea or vomiting  Post-op Vital Signs: stable  Complications: No apparent anesthesia complications

## 2012-04-10 NOTE — Op Note (Signed)
NAME:  Renee Serrano, Renee Serrano NO.:  [id number]  MEDICAL RECORD NO.:  [id number]  LOCATION:  [facility]                         FACILITY:  [facility]  PHYSICIAN:  Lodema Pilot, MD       DATE OF BIRTH:  1956/03/22  DATE OF PROCEDURE:  04/10/2012 DATE OF DISCHARGE:  04/10/2012                              OPERATIVE REPORT   PROCEDURE:  Removal of left subclavian MediPort.  PREOPERATIVE DIAGNOSIS:  History of right breast cancer.  POSTOPERATIVE DIAGNOSIS:  History of right breast cancer.  SURGEON:  Lodema Pilot, MD  ASSISTANT:  None.  ANESTHESIA:  Monitored anesthesia care with 20 mL of 1% lidocaine and 0.25% Marcaine with epinephrine in a 50:50 mixture.  FLUIDS:  600 mL crystalloid.  ESTIMATED BLOOD LOSS:  Minimal.  DRAINS:  None.  SPECIMENS:  None.  COMPLICATIONS:  None apparent.  FINDINGS:  Left subclavian power port removed and not sent to Pathology.  INDICATION FOR PROCEDURE:  Renee Serrano is a 56 year old female with history of right breast cancer.  She underwent neoadjuvant therapy followed by modified radical mastectomy and she has completed her therapy and has been cleared by Renee Serrano for port removal.  She is scheduled for followup mammogram of the left in a week and I recommended that we hold off on port removal until her mammogram is cleared but she requests that we continue and she would be willing to have port replaced if necessary.  She understands that risk.  She has recurrent cancer and she will need port replaced.  OPERATIVE DETAILS:  Renee Serrano was seen and evaluated in the preoperative area and risks and benefits of procedure were discussed in lay terms.  Informed consent was obtained.  She was given prophylactic antibiotics and the site was marked prior to anesthetic administration. I performed bilateral breast exam in the preoperative area and there was no evidence of any recurrent disease or any other disease on the left. No suspicious  masses were identified.  She was taken to the operating room, placed on the table in supine position with her left arm tucked and IV sedation was administered.  The patient was prepped and draped in a standard surgical fashion.  Procedure time-out was performed with all operative team members to confirm proper patient, procedure.  The area was anesthetized with 20 mL of 1% lidocaine and 0.25% Marcaine in a 50:50 mixture.  Her prior incision was opened up and dissection carried down to the catheter using Bovie electrocautery.  The catheter was identified and the tubing was removed from the infraclavicular area and the subclavian vein.  Pressure was held in this area and there was no evidence of any back bleeding.  A 3-0 Vicryl figure-of-eight suture was used to close this tunnel.  Then Bovie electrocautery was used to carry dissection through the subcutaneous tissue to the port reservoir.  The sutures placed at 10 o'clock and 2 o'clock position were identified and cut and removed and the port was then freed and easily removed.  The entire port and catheter appeared to be intact.  The capsule was removed with Bovie electrocautery.  Hemostasis was obtained with Bovie electrocautery and the wound was  irrigated with sterile saline solution until the irrigation returned clear and the subcutaneous tissue was approximated with running 3-0 Vicryl suture and the skin edges were approximated with 4-0 Monocryl subcuticular suture.  Skin was washed and dried and Dermabond was applied.  All sponge, needle, and instrument counts were correct at end of the case.  The patient tolerated procedure well without apparent complications.          ______________________________ Lodema Pilot, MD     BL/MEDQ  D:  04/10/2012  T:  04/10/2012  Job:  [id number]

## 2012-04-10 NOTE — Transfer of Care (Signed)
Immediate Anesthesia Transfer of Care Note  Patient: Renee Serrano  Procedure(s) Performed: Procedure(s) (LRB): REMOVAL PORT-A-CATH (N/A)  Patient Location: PACU  Anesthesia Type: MAC  Level of Consciousness: sedated, patient cooperative and responds to stimulaton  Airway & Oxygen Therapy: Patient Spontanous Breathing and Patient connected to face mask oxgen  Post-op Assessment: Report given to PACU RN and Post -op Vital signs reviewed and stable  Post vital signs: Reviewed and stable  Complications: No apparent anesthesia complications

## 2012-04-13 ENCOUNTER — Encounter (HOSPITAL_COMMUNITY): Payer: Self-pay | Admitting: General Surgery

## 2012-04-15 ENCOUNTER — Encounter (HOSPITAL_COMMUNITY): Payer: BC Managed Care – PPO

## 2012-04-16 ENCOUNTER — Encounter (HOSPITAL_COMMUNITY): Payer: BC Managed Care – PPO

## 2012-04-17 ENCOUNTER — Encounter (HOSPITAL_COMMUNITY): Payer: BC Managed Care – PPO

## 2012-04-28 ENCOUNTER — Encounter (INDEPENDENT_AMBULATORY_CARE_PROVIDER_SITE_OTHER): Payer: BC Managed Care – PPO | Admitting: General Surgery

## 2012-05-18 ENCOUNTER — Telehealth (HOSPITAL_COMMUNITY): Payer: Self-pay

## 2012-05-18 NOTE — Telephone Encounter (Signed)
Patient had UTI and her right arm below bra strap hits  (surgical side) was also red and slightly swollen and saw PCP who placed her on Bactrim for 10 days.  She completed the antibiotic and her UTI has resolved but her arm is still slightly red and swollen.  Denies any fevers or pain at the site.  She told her PCP who advised her to apply cortaid to the area.  She has not done this yet.  She wanted to let Dr. Mariel Sleet know before she did anything else to see if he wanted her to come in for someone to look at her arm.

## 2012-05-19 ENCOUNTER — Encounter (HOSPITAL_COMMUNITY): Payer: BC Managed Care – PPO | Attending: Oncology | Admitting: Oncology

## 2012-05-19 VITALS — BP 138/82 | HR 73 | Temp 98.3°F | Resp 16

## 2012-05-19 DIAGNOSIS — C50919 Malignant neoplasm of unspecified site of unspecified female breast: Secondary | ICD-10-CM | POA: Insufficient documentation

## 2012-05-19 DIAGNOSIS — J309 Allergic rhinitis, unspecified: Secondary | ICD-10-CM | POA: Insufficient documentation

## 2012-05-19 DIAGNOSIS — C50419 Malignant neoplasm of upper-outer quadrant of unspecified female breast: Secondary | ICD-10-CM

## 2012-05-19 DIAGNOSIS — R609 Edema, unspecified: Secondary | ICD-10-CM

## 2012-05-19 DIAGNOSIS — I998 Other disorder of circulatory system: Secondary | ICD-10-CM | POA: Insufficient documentation

## 2012-05-19 NOTE — Patient Instructions (Addendum)
[facility] Discharge Instructions  RECOMMENDATIONS MADE BY THE CONSULTANT AND ANY TEST RESULTS WILL BE SENT TO YOUR REFERRING PHYSICIAN.  EXAM FINDINGS BY THE PHYSICIAN TODAY AND SIGNS OR SYMPTOMS TO REPORT TO CLINIC OR PRIMARY PHYSICIAN: Exam and discussion by MD.  MD feels that area is from radiation.  MEDICATIONS PRESCRIBED:  Aleve - take 2 twice daily for 6 days    SPECIAL INSTRUCTIONS/FOLLOW-UP: Return Monday at 3:30pm  Thank you for choosing [facility] to provide your oncology and hematology care.  To afford each patient quality time with our providers, please arrive at least 15 minutes before your scheduled appointment time.  With your help, our goal is to use those 15 minutes to complete the necessary work-up to ensure our physicians have the information they need to help with your evaluation and healthcare recommendations.    Effective January 1st, 2014, we ask that you re-schedule your appointment with our physicians should you arrive 10 or more minutes late for your appointment.  We strive to give you quality time with our providers, and arriving late affects you and other patients whose appointments are after yours.    Again, thank you for choosing [facility].  Our hope is that these requests will decrease the amount of time that you wait before being seen by our physicians.       _____________________________________________________________  I acknowledge that I have been informed and understand all the instructions given to me and received a copy. I do not have anymore questions at this time but understand that I may call the Cancer Center at [facility] at [phone] during business hours should I have any further questions or need assistance in obtaining follow-up care.    __________________________________________  _____________  __________ Signature of Patient or Authorized Representative            Date                    Time    __________________________________________ Nurse's Signature

## 2012-05-19 NOTE — Progress Notes (Signed)
The patient was a work in today because of irritation underneath her right axilla. There is also redness of the skin anterior to the axilla in the area of approximately 8 cm x 3 cm. Only this anterior area was slightly warm to the touch. She had no fever no chills and was treated as if she had a skin infection she states prostate 2 weeks ago with Bactrim. There is no nodularity to the right chest wall. She is in no acute distress. The left side is intact. She has no adenopathy in the cervical, supraclavicular, infraclavicular or axillary areas.  There is minimal swelling posteriorly in the axilla and anteriorly which I think is post radiation therapy changes and not infection at this point. She'll take 2 Aleve twice a day and see me on Monday

## 2012-05-19 NOTE — Progress Notes (Signed)
Had UTI and noted redness & swelling, near right axilla at the same time.  PCP placed her on Bactrim and felt should take care of UTI and any skin infection.  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  Denies any pain or tenderness and has not had any fever.

## 2012-05-25 ENCOUNTER — Encounter (HOSPITAL_BASED_OUTPATIENT_CLINIC_OR_DEPARTMENT_OTHER): Payer: BC Managed Care – PPO | Admitting: Oncology

## 2012-05-25 VITALS — BP 143/75 | HR 90 | Temp 98.5°F | Resp 16

## 2012-05-25 DIAGNOSIS — C50419 Malignant neoplasm of upper-outer quadrant of unspecified female breast: Secondary | ICD-10-CM

## 2012-05-25 NOTE — Progress Notes (Signed)
Here for follow-up of redness below right axilla.  Has been taking aleve 2 pills twice daily since here last week.  Thinks the area is less reddened.  Denies pain or any fever.

## 2012-05-25 NOTE — Progress Notes (Signed)
The patient is here today with less erythema in my opinion underneath her right axilla and my nurse agrees. I think the erythema anteriorly in the upper axillary area is also less red. There is no indication that she has tumor recurrence or infection at this point in time.  I want her to stop the Aleve and just follow things along and let me know if things change and we will work her in on an urgent basis if need be.

## 2012-05-25 NOTE — Patient Instructions (Addendum)
[facility] Discharge Instructions  RECOMMENDATIONS MADE BY THE CONSULTANT AND ANY TEST RESULTS WILL BE SENT TO YOUR REFERRING PHYSICIAN.  EXAM FINDINGS BY THE PHYSICIAN TODAY AND SIGNS OR SYMPTOMS TO REPORT TO CLINIC OR PRIMARY PHYSICIAN: exam and discussion by MD.  Lisabeth Devoid area is radiation recall and don't think you need to do anything.  If you begin to have pain, swelling or other problems with the area let us know and we will see you.  MEDICATIONS PRESCRIBED:  none    SPECIAL INSTRUCTIONS/FOLLOW-UP: Follow-up as scheduled.  Thank you for choosing [facility] to provide your oncology and hematology care.  To afford each patient quality time with our providers, please arrive at least 15 minutes before your scheduled appointment time.  With your help, our goal is to use those 15 minutes to complete the necessary work-up to ensure our physicians have the information they need to help with your evaluation and healthcare recommendations.    Effective January 1st, 2014, we ask that you re-schedule your appointment with our physicians should you arrive 10 or more minutes late for your appointment.  We strive to give you quality time with our providers, and arriving late affects you and other patients whose appointments are after yours.    Again, thank you for choosing [facility].  Our hope is that these requests will decrease the amount of time that you wait before being seen by our physicians.       _____________________________________________________________  I acknowledge that I have been informed and understand all the instructions given to me and received a copy. I do not have anymore questions at this time but understand that I may call the Cancer Center at [facility] at [phone] during business hours should I have any further questions or need assistance in obtaining follow-up care.    __________________________________________   _____________  __________ Signature of Patient or Authorized Representative            Date                   Time    __________________________________________ Nurse's Signature

## 2012-06-17 ENCOUNTER — Ambulatory Visit (HOSPITAL_COMMUNITY)
Admission: RE | Admit: 2012-06-17 | Discharge: 2012-06-17 | Disposition: A | Discharge status: Home / Self Care | Payer: BC Managed Care – PPO | Source: Ambulatory Visit | Attending: Oncology | Admitting: Oncology

## 2012-06-17 ENCOUNTER — Other Ambulatory Visit (HOSPITAL_COMMUNITY): Payer: Self-pay | Admitting: Oncology

## 2012-06-17 ENCOUNTER — Telehealth (HOSPITAL_COMMUNITY): Payer: Self-pay

## 2012-06-17 DIAGNOSIS — Z9889 Other specified postprocedural states: Secondary | ICD-10-CM | POA: Insufficient documentation

## 2012-06-17 DIAGNOSIS — N644 Mastodynia: Secondary | ICD-10-CM | POA: Insufficient documentation

## 2012-06-17 DIAGNOSIS — C50919 Malignant neoplasm of unspecified site of unspecified female breast: Secondary | ICD-10-CM | POA: Insufficient documentation

## 2012-06-17 NOTE — Telephone Encounter (Signed)
Message left on patient's voicemail that mammogram and ultrasound were negative.  To call back with any questions.

## 2012-06-26 ENCOUNTER — Encounter (INDEPENDENT_AMBULATORY_CARE_PROVIDER_SITE_OTHER): Payer: Self-pay | Admitting: General Surgery

## 2012-07-04 ENCOUNTER — Encounter (INDEPENDENT_AMBULATORY_CARE_PROVIDER_SITE_OTHER): Payer: Self-pay | Admitting: General Surgery

## 2012-07-17 ENCOUNTER — Ambulatory Visit (INDEPENDENT_AMBULATORY_CARE_PROVIDER_SITE_OTHER): Payer: BC Managed Care – PPO | Admitting: General Surgery

## 2012-07-25 ENCOUNTER — Other Ambulatory Visit: Payer: Self-pay

## 2012-07-29 ENCOUNTER — Encounter (INDEPENDENT_AMBULATORY_CARE_PROVIDER_SITE_OTHER): Payer: Self-pay | Admitting: General Surgery

## 2012-07-29 ENCOUNTER — Ambulatory Visit (INDEPENDENT_AMBULATORY_CARE_PROVIDER_SITE_OTHER): Payer: BC Managed Care – PPO | Admitting: General Surgery

## 2012-07-29 VITALS — BP 140/88 | HR 78 | Temp 98.6°F | Resp 18 | Ht 66.0 in | Wt 216.0 lb

## 2012-07-29 DIAGNOSIS — Z853 Personal history of malignant neoplasm of breast: Secondary | ICD-10-CM

## 2012-07-29 NOTE — Progress Notes (Signed)
Subjective:     Patient ID: Renee Serrano, female   DOB: 02/21/1956, 57 y.o.   MRN: [id number]  HPI This patient follows up status post right modified radical mastectomy in May of 2013 for a stage II a right breast cancer. She seems to be improving each time I see her. She continues to have some mild lymphedema of her right arm which responds well to a compression sleeve. She recently had a mammogram and ultrasound of her left breast and this was normal.  She is followed by Dr. Mariel Sleet as well. She did have some erythema of the  right shoulder which was felt to be due to the radiation. She did have a course of antibiotics without improvement. She says that this is basically been unchanged.  Review of Systems     Objective:   Physical Exam No acute distress and nontoxic-appearing Her left breast is normal to exam without any suspicious masses, skin changes or lymphadenopathy Her right breast is normal postoperative without any suspicious masses. She still has some radiation changes and thickening to the skin but do not appreciate any evidence of recurrent disease.    Assessment:     History of right breast cancer status post right modified radical mastectomy-doing well She is doing very well status post right modified radical mastectomy for a triple negative right breast cancer in May of 2013. She continues to improve each time that I see her. She is also followed by her medical oncologist Dr. Mariel Sleet. Her mammogram was normal and she denies any changes on self breast exam.     Plan:     She is doing well and I recommended that she continue with her monthly self breast exams and annual mammograms. I will see her back in one year unless she has any issues in the meantime. She will continue to follow up with her medical oncologist as well.

## 2012-08-27 IMAGING — CT CT ABD-PELV W/ CM
2 of 5 series · 16 of 46 positions shown, 18 images · IV contrast (Omnipaque 300)
Comparison: Bone scan 06/07/2011

CLINICAL DATA: Right-sided breast cancer.  Staging.

CT ABDOMEN AND PELVIS WITH CONTRAST
TECHNIQUE: Multidetector CT imaging of the abdomen and pelvis was
performed following the standard protocol during bolus
administration of intravenous contrast.
Contrast: 100mL OMNIPAQUE IOHEXOL 300 MG/ML IV SOLN

[Series 2: abd_pel_with 5.0 b40f · axial · 0.79mm/px · z∈[-470,-40]mm · 13 of 98 slices shown, 15 images]
[im 6/98  soft-tissue]
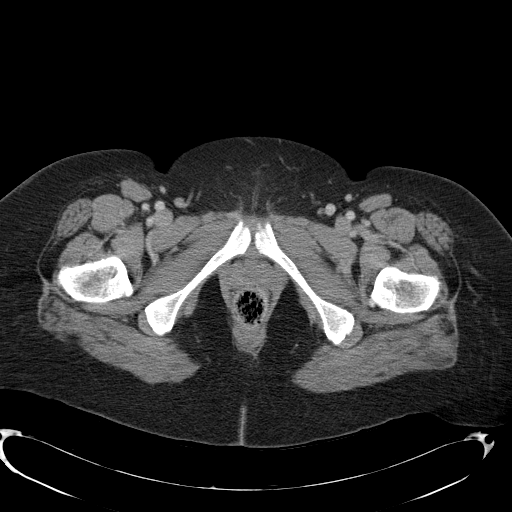
[im 6/98  bone]
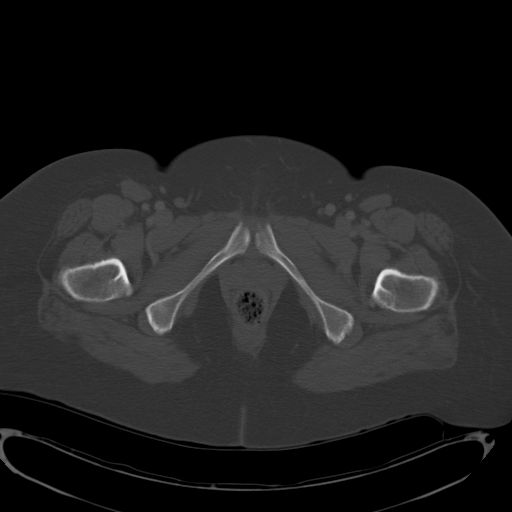
[im 12/98  soft-tissue]
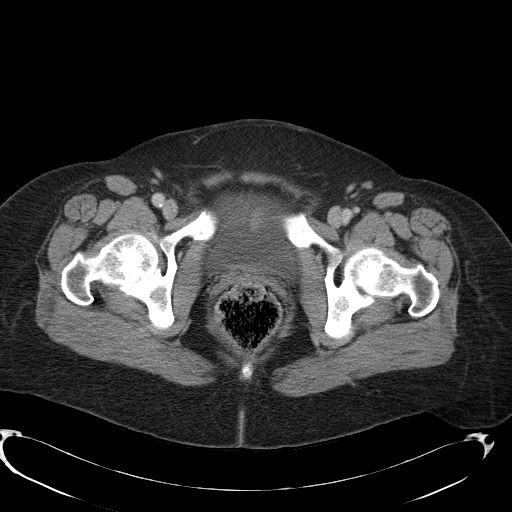
[im 23/98  soft-tissue]
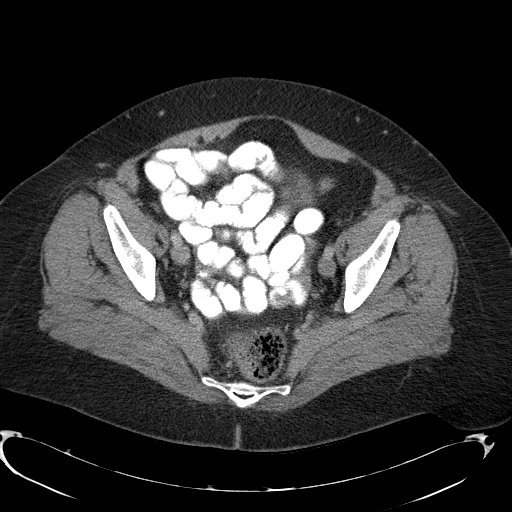
[im 29/98  soft-tissue]
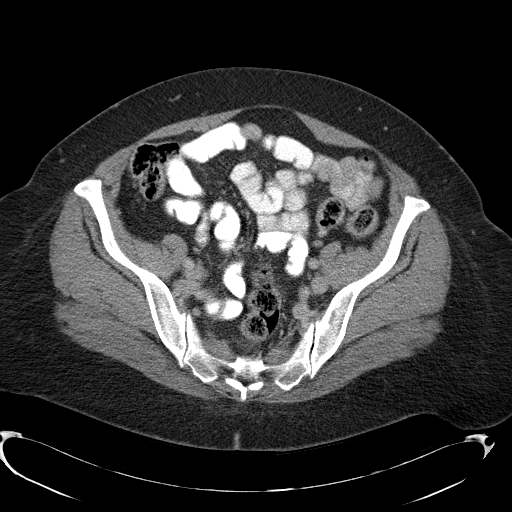
[im 35/98  soft-tissue]
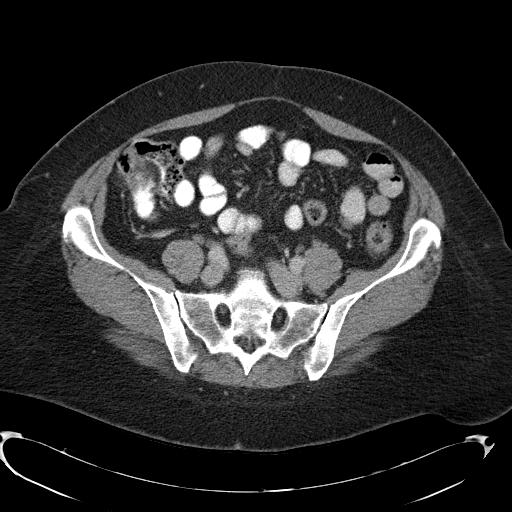
[im 40/98  soft-tissue]
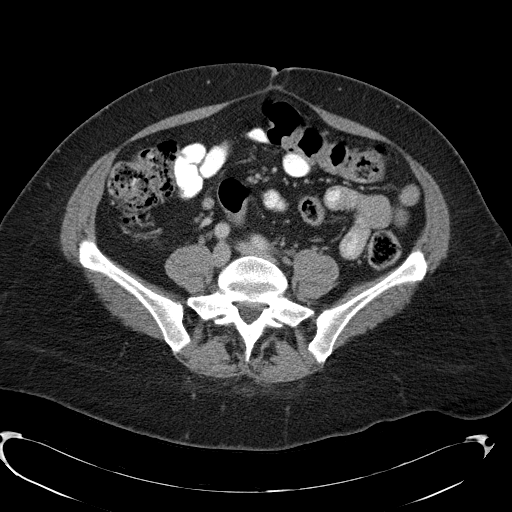
[im 52/98  soft-tissue]
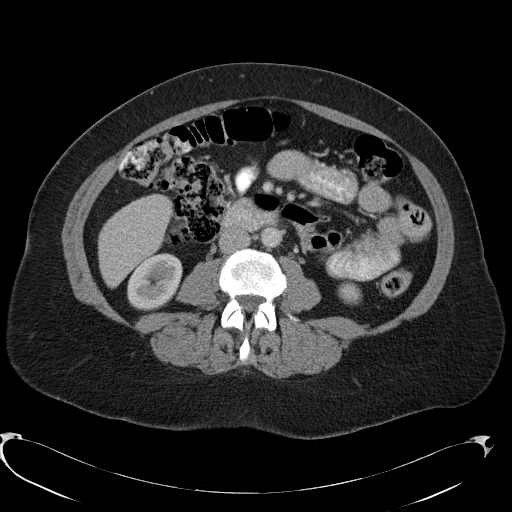
[im 58/98  soft-tissue]
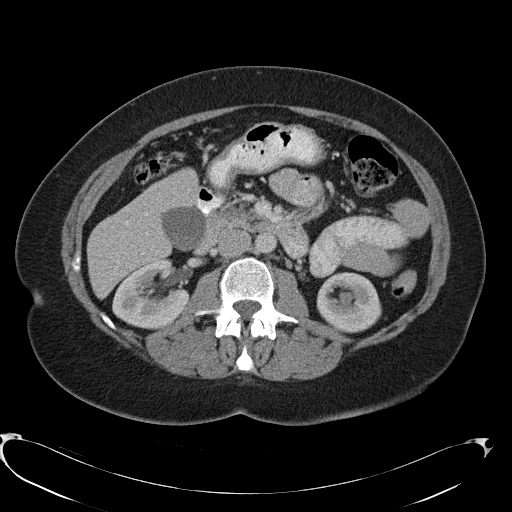
[im 63/98  soft-tissue]
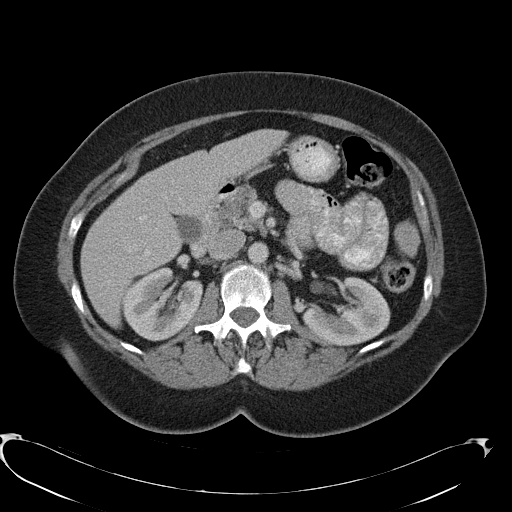
[im 63/98  bone]
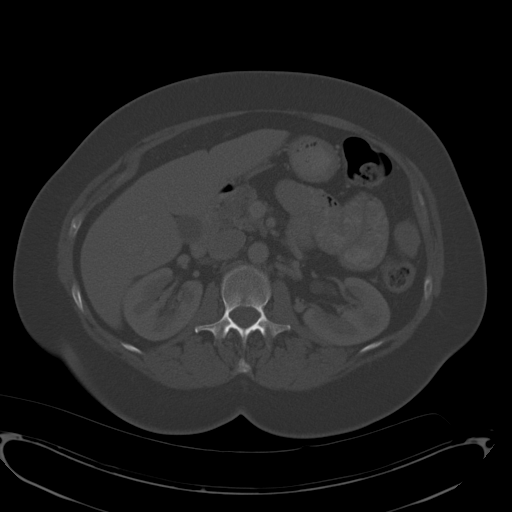
[im 69/98  soft-tissue]
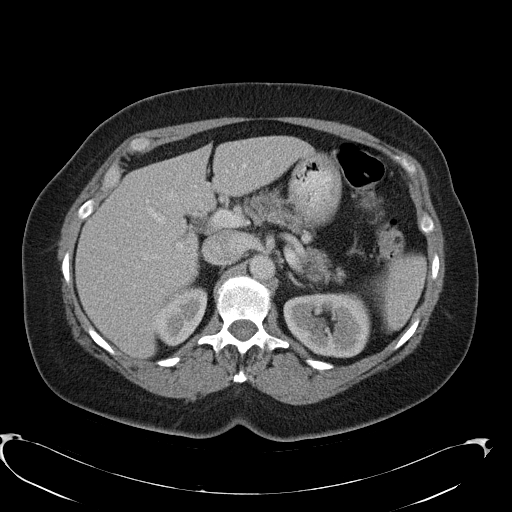
[im 75/98  soft-tissue]
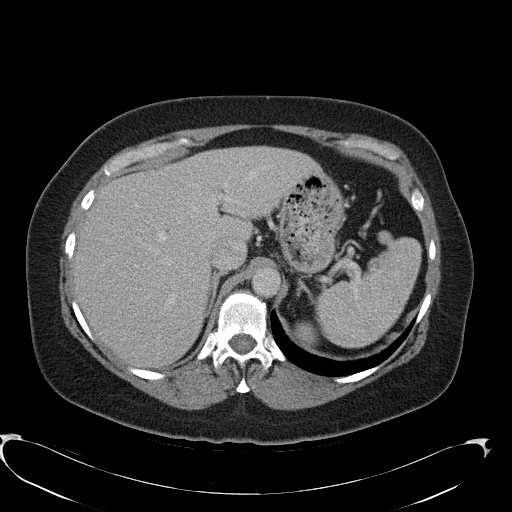
[im 86/98  soft-tissue]
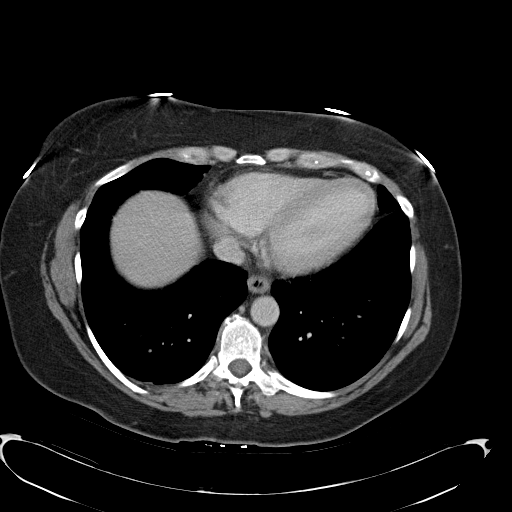
[im 92/98  soft-tissue]
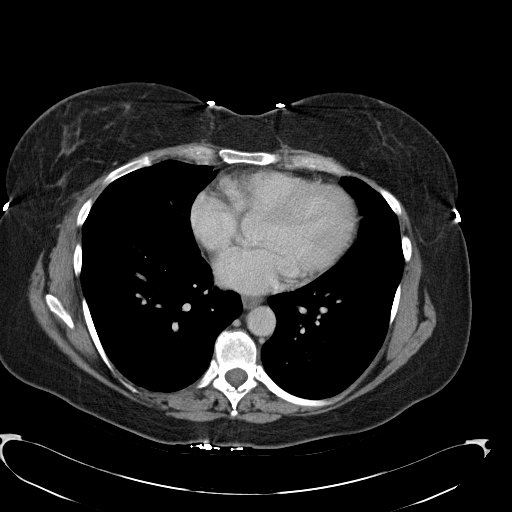

[Series 4: abd_pel_with 3.0 spo · coronal · 0.71mm/px · 3 of 94 slices shown]
[im 32/94  soft-tissue]
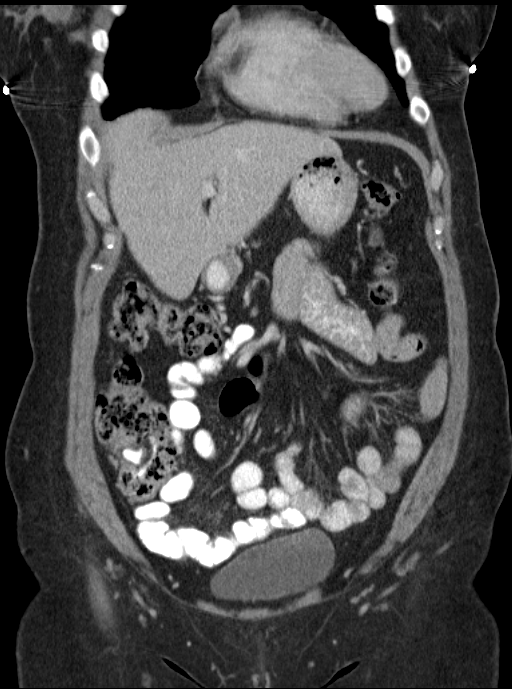
[im 42/94  soft-tissue]
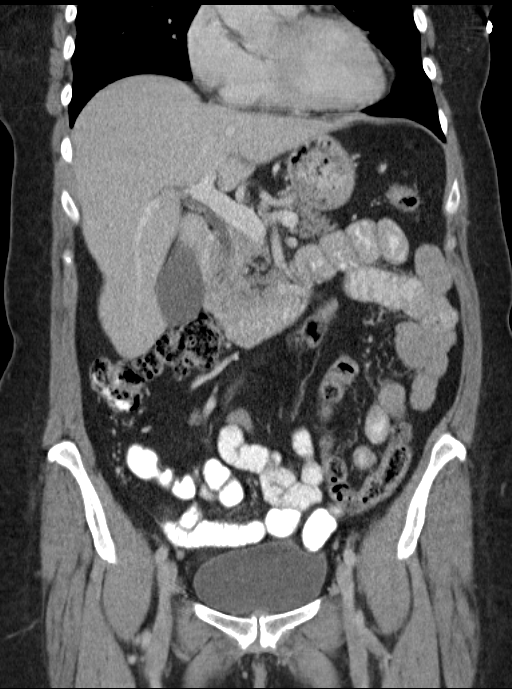
[im 52/94  soft-tissue]
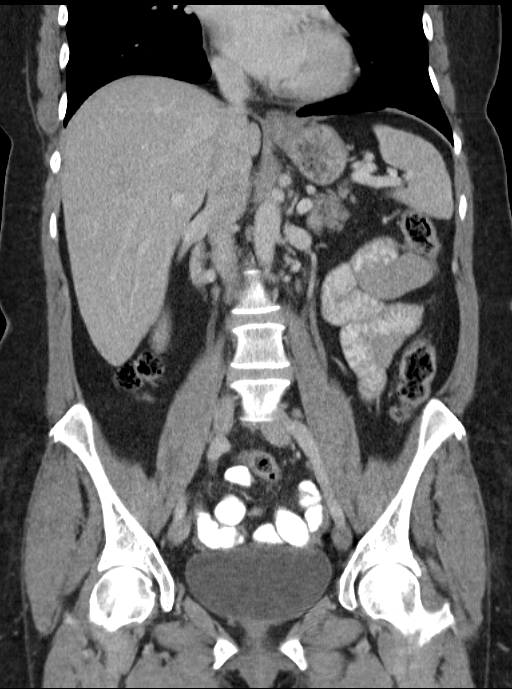

[16 of 46 positions shown; findings below may reference images not displayed]

FINDINGS: Lung bases are clear.  No pleural or pericardial fluid.

The liver is normal.  No calcified gallstones.  The spleen is
normal.  The pancreas is normal.  The adrenal glands are normal.
The kidneys are normal.  No cyst, mass, stone or hydronephrosis.
The aorta and IVC are normal.  No retroperitoneal mass or
adenopathy.  No free intraperitoneal fluid or air.  There is been
previous hysterectomy.  No pelvic mass or adenopathy.  No primary
bowel pathology is evident.

On the nasal images, there is an abnormality deep within the right
breast measuring 2.9 cm in diameter.  This could be of mass or
postsurgical change.  There could questionably be a second mass or
lymph node deep to that to adjacent to the chest wall.  No sign of
osseous metastatic disease.  The spine has unremarkable appearance.
IMPRESSION: No evidence of metastatic disease in the region of the abdomen or
pelvis.  In the deep right breast, there is a 2.9 cm abnormality on
initial images that could represent a primary mass or sequelae of
biopsy.  There is a 1 cm density deep to that adjacent to the chest
wall which could be a mass or lymph node.

## 2012-08-31 ENCOUNTER — Encounter (HOSPITAL_COMMUNITY): Payer: BC Managed Care – PPO | Attending: Oncology | Admitting: Oncology

## 2012-08-31 ENCOUNTER — Encounter (HOSPITAL_COMMUNITY): Payer: Self-pay | Admitting: Oncology

## 2012-08-31 VITALS — BP 153/80 | HR 66 | Temp 97.8°F | Resp 16 | Wt 218.6 lb

## 2012-08-31 DIAGNOSIS — R21 Rash and other nonspecific skin eruption: Secondary | ICD-10-CM

## 2012-08-31 DIAGNOSIS — R071 Chest pain on breathing: Secondary | ICD-10-CM

## 2012-08-31 DIAGNOSIS — C50411 Malignant neoplasm of upper-outer quadrant of right female breast: Secondary | ICD-10-CM

## 2012-08-31 DIAGNOSIS — C50419 Malignant neoplasm of upper-outer quadrant of unspecified female breast: Secondary | ICD-10-CM

## 2012-08-31 DIAGNOSIS — Z171 Estrogen receptor negative status [ER-]: Secondary | ICD-10-CM

## 2012-08-31 NOTE — Patient Instructions (Addendum)
[facility] Discharge Instructions  RECOMMENDATIONS MADE BY THE CONSULTANT AND ANY TEST RESULTS WILL BE SENT TO YOUR REFERRING PHYSICIAN.  EXAM FINDINGS BY THE PHYSICIAN TODAY AND SIGNS OR SYMPTOMS TO REPORT TO CLINIC OR PRIMARY PHYSICIAN: Exam and discussion by MD.  MEDICATIONS PRESCRIBED:  Hydrocortisone cream 2.5% - Apply to areas on neck twice daily for 7 - 10 days.  If rash worsens or does not go away, see your dermatologist.  INSTRUCTIONS GIVEN AND DISCUSSED: Report any new lumps, bone pain or shortness of breath  SPECIAL INSTRUCTIONS/FOLLOW-UP: Follow-up in 1 month to see MD.  Thank you for choosing [facility] to provide your oncology and hematology care.  To afford each patient quality time with our providers, please arrive at least 15 minutes before your scheduled appointment time.  With your help, our goal is to use those 15 minutes to complete the necessary work-up to ensure our physicians have the information they need to help with your evaluation and healthcare recommendations.    Effective January 1st, 2014, we ask that you re-schedule your appointment with our physicians should you arrive 10 or more minutes late for your appointment.  We strive to give you quality time with our providers, and arriving late affects you and other patients whose appointments are after yours.    Again, thank you for choosing [facility].  Our hope is that these requests will decrease the amount of time that you wait before being seen by our physicians.       _____________________________________________________________  Should you have questions after your visit to [facility], please contact our office at [phone] between the hours of 8:30 a.m. and 5:00 p.m.  Voicemails left after 4:30 p.m. will not be returned until the following business day.  For prescription refill requests, have your pharmacy contact our office with your  prescription refill request.

## 2012-08-31 NOTE — Progress Notes (Signed)
Problem #1 breast cancer, right sided, triple negative, clinically 5 x 7 cm at presentation. On MRI was 4 cm, possibly larger. On both CT scan and MRI scans there were abnormal lymph nodes in the axilla and a lesion along the chest wall very worrisome for disease. The lymph node biopsy which was very small was negative but she received dose dense chemotherapy with Adriamycin and Cytoxan followed by dose dense Taxol. Both were for 4 cycles. She then went on radiation therapy after her mastectomy and lymph node dissection which showed no residual disease in the breast or in the lymph nodes. That date of surgery was 10/16/2011. She went on have radiation therapy. She is back to work. She has been out of town on work and developed a rash his past weekend in the neck predominantly on the left. I think the rash is actually on both sides of her neck left and right just more, left extending down to the base of the neck on the upper portion of the chest. She has a couple small spots her left forearm 1 underneath her left breast consistent with either contact dermatitis or possibly insect bites. There no pustules but this rash is minimally palpable in places. It's erythematous and very pruritic she states. We will place her on some hydrocortisone cream and if she is not better she is to see her dermatologist.  She is also concerned about an area of the right chest wall but now is thickened as it has been but slightly red in the last few days to week.  Otherwise she has no lymphadenopathy in the cervical, supraclavicular, infraclavicular, axillary, or inguinal areas. Back exam is unremarkable. Lungs are clear. Heart shows a regular rhythm and rate. The left breast is negative except for one small little red area about the size of a dot.abdomen is soft and nontender without hepatosplenomegaly. She has gained a few pounds since I've seen her. She has no leg edema. She has puffiness of the right arm but is wearing her sleeve  and she still has minimal redness at the top of her right upper arm. That is not new or different. The right chest wall though shows at least a 4 cm x 3 cm area of induration and in the center section there is a 1.5 x 1 cm area of minimal redness.  This appears to be possibly new bowel or see her back myself in 4 weeks to confirm if it still present. I will send a note to her surgeon, Dr. Biagio Quint.

## 2012-09-18 ENCOUNTER — Telehealth (HOSPITAL_COMMUNITY): Payer: Self-pay | Admitting: Oncology

## 2012-09-30 ENCOUNTER — Ambulatory Visit (HOSPITAL_COMMUNITY): Payer: BC Managed Care – PPO | Admitting: Oncology

## 2012-11-11 ENCOUNTER — Encounter (HOSPITAL_COMMUNITY): Payer: BC Managed Care – PPO | Attending: Oncology | Admitting: Oncology

## 2012-11-11 ENCOUNTER — Encounter (HOSPITAL_COMMUNITY): Payer: Self-pay | Admitting: Oncology

## 2012-11-11 VITALS — BP 131/79 | HR 66 | Temp 98.4°F | Resp 16 | Wt 220.8 lb

## 2012-11-11 DIAGNOSIS — C50411 Malignant neoplasm of upper-outer quadrant of right female breast: Secondary | ICD-10-CM

## 2012-11-11 DIAGNOSIS — Z171 Estrogen receptor negative status [ER-]: Secondary | ICD-10-CM

## 2012-11-11 DIAGNOSIS — C50419 Malignant neoplasm of upper-outer quadrant of unspecified female breast: Secondary | ICD-10-CM | POA: Insufficient documentation

## 2012-11-11 NOTE — Progress Notes (Signed)
#  1 right sided infiltrating ductal carcinoma breast, triple negative, clinically 5 x 7 cm at presentation. By MRI it was 4 cm, possibly larger but with both CT scans and MRI scans she had abnormal appearing lymph nodes in the axilla and a lesion along the chest wall, very worrisome for disease. The lymph node biopsy was a very small biopsy that was negative of this lesion. She was treated dose dense chemotherapy with Adriamycin and Cytoxan followed by dose dense Taxol both for 4 cycles. She then went on to have radiation therapy after her mastectomy and lymph node dissection which actually showed no residual disease in the breast or in the lymph nodes. Date of surgery was 10/16/2011. She is fully functional presently, back to work but still gets somewhat fatigued in the day.  #2 contact dermatitis from hairspray on her neck which is resolved with cessation of use #3 excess weight but she is working on that with exercise a regular basis now. She is try to modify her diet.  He really has done well. The thickened area in the right chest walls also still present but she is not bothered by typically. She occasionally gets a sharp discomfort when she picks up her grandchild her something heavy. I think she does need a massage his area of thickening in the medial portion of the lower chest wall. There is no erythema to this area but it is a thickened area about 4 x 3 cm. It is just postoperative changes in my opinion. She has no nodules. The left breast is negative for masses. She has no lymphadenopathy in the cervical, supraclavicular, infraclavicular or inguinal or axillary areas. She does have mild lymphedema of the right arm and is wearing a sleeve. Left arm is negative. She has no leg edema. Abdomen remains soft and nontender without hepatosplenomegaly. Bowel sounds are normal. Heart exam shows a regular rhythm and rate without murmur rub or gallop. Lungs are clear to auscultation and percussion. She is alert and  oriented. Skin exam otherwise is negative. She is no thyromegaly.  She does not have BRCA1 or BRCA2 mutations.  She is seen a gynecologist about her uterus and ovaries every year I think that is reasonable. She was told that she might need to have a hysterectomy since her insurance did not pay for the workup of Lynch syndrome however she does not clearly have this and therefore I would not recommend surgery nor did her gynecologist at this time.  We will see her in 6 months. She does not need blood work today. We will consider CBC and differential and cmet in 6 months. I suspect once a year blood work is all that is indicated. Presently she remains free of disease.

## 2012-11-11 NOTE — Patient Instructions (Addendum)
[facility] Discharge Instructions  RECOMMENDATIONS MADE BY THE CONSULTANT AND ANY TEST RESULTS WILL BE SENT TO YOUR REFERRING PHYSICIAN.  EXAM FINDINGS BY THE PHYSICIAN TODAY AND SIGNS OR SYMPTOMS TO REPORT TO CLINIC OR PRIMARY PHYSICIAN: Exam and discussion by MD.  Renee Serrano are doing well.  No evidence of recurrence by exam.  Continue the walking and exercise.  Massage area on your right chest wall to help soften the scar tissue.  MEDICATIONS PRESCRIBED:  none  INSTRUCTIONS GIVEN AND DISCUSSED: Report any new lumps, bone pain, shortness of breath or other symptoms.  SPECIAL INSTRUCTIONS/FOLLOW-UP: Follow-up in 6 months with blood work and exam.  Thank you for choosing [facility] to provide your oncology and hematology care.  To afford each patient quality time with our providers, please arrive at least 15 minutes before your scheduled appointment time.  With your help, our goal is to use those 15 minutes to complete the necessary work-up to ensure our physicians have the information they need to help with your evaluation and healthcare recommendations.    Effective January 1st, 2014, we ask that you re-schedule your appointment with our physicians should you arrive 10 or more minutes late for your appointment.  We strive to give you quality time with our providers, and arriving late affects you and other patients whose appointments are after yours.    Again, thank you for choosing [facility].  Our hope is that these requests will decrease the amount of time that you wait before being seen by our physicians.       _____________________________________________________________  Should you have questions after your visit to [facility], please contact our office at [phone] between the hours of 8:30 a.m. and 5:00 p.m.  Voicemails left after 4:30 p.m. will not be returned until the following business day.  For prescription refill  requests, have your pharmacy contact our office with your prescription refill request.

## 2013-04-15 ENCOUNTER — Other Ambulatory Visit: Payer: Self-pay

## 2013-05-01 ENCOUNTER — Encounter (HOSPITAL_COMMUNITY): Payer: Self-pay | Admitting: Oncology

## 2013-05-14 ENCOUNTER — Encounter (HOSPITAL_COMMUNITY): Payer: Self-pay

## 2013-05-14 ENCOUNTER — Encounter (HOSPITAL_COMMUNITY): Payer: PRIVATE HEALTH INSURANCE | Attending: Hematology and Oncology

## 2013-05-14 VITALS — BP 145/88 | HR 75 | Temp 98.0°F | Resp 18 | Wt 220.4 lb

## 2013-05-14 DIAGNOSIS — J309 Allergic rhinitis, unspecified: Secondary | ICD-10-CM

## 2013-05-14 DIAGNOSIS — Z853 Personal history of malignant neoplasm of breast: Secondary | ICD-10-CM | POA: Insufficient documentation

## 2013-05-14 DIAGNOSIS — C50419 Malignant neoplasm of upper-outer quadrant of unspecified female breast: Secondary | ICD-10-CM

## 2013-05-14 DIAGNOSIS — R5381 Other malaise: Secondary | ICD-10-CM

## 2013-05-14 DIAGNOSIS — Z09 Encounter for follow-up examination after completed treatment for conditions other than malignant neoplasm: Secondary | ICD-10-CM | POA: Insufficient documentation

## 2013-05-14 DIAGNOSIS — C50411 Malignant neoplasm of upper-outer quadrant of right female breast: Secondary | ICD-10-CM

## 2013-05-14 DIAGNOSIS — Z23 Encounter for immunization: Secondary | ICD-10-CM

## 2013-05-14 DIAGNOSIS — R5383 Other fatigue: Secondary | ICD-10-CM

## 2013-05-14 LAB — CBC WITH DIFFERENTIAL/PLATELET
Basophils Absolute: 0.1 10*3/uL (ref 0.0–0.1)
Basophils Relative: 1 % (ref 0–1)
MCHC: 32.8 g/dL (ref 30.0–36.0)
Monocytes Absolute: 0.9 10*3/uL (ref 0.1–1.0)
Neutro Abs: 6.7 10*3/uL (ref 1.7–7.7)
Neutrophils Relative %: 66 % (ref 43–77)
Platelets: 278 10*3/uL (ref 150–400)
RDW: 12.5 % (ref 11.5–15.5)
WBC: 10 10*3/uL (ref 4.0–10.5)

## 2013-05-14 LAB — COMPREHENSIVE METABOLIC PANEL WITH GFR
ALT: 20 U/L (ref 0–35)
AST: 20 U/L (ref 0–37)
Albumin: 3.9 g/dL (ref 3.5–5.2)
Alkaline Phosphatase: 84 U/L (ref 39–117)
BUN: 15 mg/dL (ref 6–23)
CO2: 29 meq/L (ref 19–32)
Calcium: 9.6 mg/dL (ref 8.4–10.5)
Chloride: 103 meq/L (ref 96–112)
Creatinine, Ser: 0.73 mg/dL (ref 0.50–1.10)
GFR calc Af Amer: >90 mL/min
GFR calc non Af Amer: >90 mL/min
Glucose, Bld: 82 mg/dL (ref 70–99)
Potassium: 4 meq/L (ref 3.5–5.1)
Sodium: 141 meq/L (ref 135–145)
Total Bilirubin: 0.2 mg/dL — ABNORMAL LOW (ref 0.3–1.2)
Total Protein: 7.6 g/dL (ref 6.0–8.3)

## 2013-05-14 MED ORDER — PNEUMOCOCCAL VAC POLYVALENT 25 MCG/0.5ML IJ INJ
0.5000 mL | INJECTION | Freq: Once | INTRAMUSCULAR | Status: AC
  Administered 2013-05-14: 0.5 mL via INTRAMUSCULAR
  Filled 2013-05-14: qty 0.5

## 2013-05-14 NOTE — Progress Notes (Signed)
Renee Serrano presents today for injection per MD orders. Pneumovax 23 administered IM in left Upper Arm. Administration without incident. Patient tolerated well.

## 2013-05-14 NOTE — Progress Notes (Signed)
[facility]  OFFICE PROGRESS NOTE  Renee Dauer, MD [address], [address] [place] [address]  DIAGNOSIS: Cancer of upper-outer quadrant of female breast, right - Plan: CEA, Cancer antigen 27.29, CBC with Differential, Comprehensive metabolic panel, CEA, Cancer antigen 27.29, MM Digital Screening Unilat L, CANCELED: MM Digital Diagnostic Unilat R  Allergic rhinitis  Fatigue - Plan: TSH, TSH  Chief Complaint  Patient presents with  . Breast Cancer    CURRENT THERAPY: Watchful expectation.  INTERVAL HISTORY: Renee Serrano 57 y.o. female returns for followup of right breast cancer, triple negative, treated with right modified radical mastectomy on 10/16/2011 followed by 4 cycles of dose dense AC. followed by dose dense Taxol for 4 cycles. She did receive external beam radiotherapy. Primary tumor was 4 cm in size. She has experienced 2 falling episodes of both were circumstantial in anyone in those circumstances would've fallen. She denies any peripheral paresthesias, headache, but has been more fatigued of late with brittle fingernails. She denies any lower extremity swelling or redness, and does wear a lymphedema sleeve on the right upper extremity. She does have chronic cough due to posterior nasal drip for which she does take Nasonex and occasionally inhaled albuterol. She denies any fever, night sweats, hot flashes, vaginal bleeding or discharge, dysuria, hematuria, urinary hesitancy, incontinence, diarrhea, constipation, melena, hematochezia, hematuria, hemoptysis, epistaxis, headache, or seizures.  MEDICAL HISTORY: Past Medical History  Diagnosis Date  . Sinus drainage   . Anxiety   . DVT (deep venous thrombosis) 2000    does not know why-takes asa; LLL  . Allergy   . Asthma   . Hyperlipidemia   . Restless leg syndrome   . Complication of anesthesia     paniced first surg-had to sedate prior to surg  . Sleep apnea     does use  cpap  . Migraines     "hx; taken care of w/sinus OR"  . Breast cancer 05/2011    right  . Hypertension     INTERIM HISTORY: has Cancer of upper-outer quadrant of female breast and Breast cancer on her problem list.   right sided infiltrating ductal carcinoma breast, triple negative, clinically 5 x 7 cm at presentation. By MRI it was 4 cm, possibly larger but with both CT scans and MRI scans she had abnormal appearing lymph nodes in the axilla and a lesion along the chest wall, very worrisome for disease. The lymph node biopsy was a very small biopsy that was negative of this lesion. She was treated dose dense chemotherapy with Adriamycin and Cytoxan followed by dose dense Taxol both for 4 cycles. She then went on to have radiation therapy after her mastectomy and lymph node dissection which actually showed no residual disease in the breast or in the lymph nodes. Date of surgery was 10/16/2011  ALLERGIES:  is allergic to penicillins and hydrocodone.  MEDICATIONS: has a current medication list which includes the following prescription(s): vitamin c, aspirin, calcium carbonate-vitamin d, cetirizine, citalopram, docusate sodium, mometasone, montelukast, multivitamin, naproxen sodium, proair hfa, ropinirole, and simvastatin.  SURGICAL HISTORY:  Past Surgical History  Procedure Laterality Date  . Functional endoscopic sinus surgery  ~ 1997  . Colonoscopy    . Portacath placement  06/17/2011    Procedure: INSERTION PORT-A-CATH;  Surgeon: Rulon Abide, DO;  Location: [facility];  Service: General;  Laterality: Left;  left side mediport placement  . Mastectomy modified radical  w/ axillary lymph nodes w/ or w/o pectoralis minor  10/16/11    right, no residual ca, 10/10 nodes neg  . Breast biopsy  05/07/2011    right- infiltrating ductal ca, ER/PR -, HER 2 -  . Vaginal hysterectomy  2004    has ovaries  . Port-a-cath removal  04/10/2012    Procedure: REMOVAL PORT-A-CATH;  Surgeon:  Lodema Pilot, DO;  Location: [facility];  Service: General;  Laterality: N/A;    FAMILY HISTORY: family history includes Cancer in her cousin, cousin, maternal aunt, maternal aunt, maternal grandmother, and mother.  SOCIAL HISTORY:  reports that she has never smoked. She has never used smokeless tobacco. She reports that she does not drink alcohol or use illicit drugs.  REVIEW OF SYSTEMS:  Other than that discussed above is noncontributory.  PHYSICAL EXAMINATION: ECOG PERFORMANCE STATUS: 1 - Symptomatic but completely ambulatory  Blood pressure 145/88, pulse 75, temperature 98 F (36.7 C), temperature source Oral, resp. rate 18, weight 220 lb 6.4 oz (99.973 kg).  GENERAL:alert, no distress and comfortable SKIN: skin color, texture, turgor are normal, no rashes or significant lesions. Nails are thin and brittle. EYES: PERLA; Conjunctiva are pink and non-injected, sclera clear OROPHARYNX:no exudate, no erythema on lips, buccal mucosa, or tongue.Marland Kitchen NECK: supple, thyroid normal size, non-tender, without nodularity. No masses CHEST: Status post right mastectomy with induration. No simultaneous nodules. Left breast pendulous with no masses. LYMPH:  no palpable lymphadenopathy in the cervical, axillary or inguinal LUNGS: clear to auscultation and percussion with normal breathing effort HEART: regular rate & rhythm and no murmurs. ABDOMEN:abdomen soft, non-tender and normal bowel sounds MUSCULOSKELETAL:no cyanosis of digits and no clubbing. Range of motion normal.  NEURO: alert & oriented x 3 with fluent speech, no focal motor/sensory deficits. DTRs normal.   LABORATORY DATA: Office Visit on 05/14/2013  Component Date Value Range Status  . WBC 05/14/2013 10.0  4.0 - 10.5 K/uL Final  . RBC 05/14/2013 4.35  3.87 - 5.11 MIL/uL Final  . Hemoglobin 05/14/2013 13.4  12.0 - 15.0 g/dL Final  . HCT 98/04/9146 40.9  36.0 - 46.0 % Final  . MCV 05/14/2013 94.0  78.0 - 100.0 fL Final  . MCH 05/14/2013  30.8  26.0 - 34.0 pg Final  . MCHC 05/14/2013 32.8  30.0 - 36.0 g/dL Final  . RDW 82/95/6213 12.5  11.5 - 15.5 % Final  . Platelets 05/14/2013 278  150 - 400 K/uL Final  . Neutrophils Relative % 05/14/2013 66  43 - 77 % Final  . Neutro Abs 05/14/2013 6.7  1.7 - 7.7 K/uL Final  . Lymphocytes Relative 05/14/2013 18  12 - 46 % Final  . Lymphs Abs 05/14/2013 1.8  0.7 - 4.0 K/uL Final  . Monocytes Relative 05/14/2013 9  3 - 12 % Final  . Monocytes Absolute 05/14/2013 0.9  0.1 - 1.0 K/uL Final  . Eosinophils Relative 05/14/2013 7* 0 - 5 % Final  . Eosinophils Absolute 05/14/2013 0.7  0.0 - 0.7 K/uL Final  . Basophils Relative 05/14/2013 1  0 - 1 % Final  . Basophils Absolute 05/14/2013 0.1  0.0 - 0.1 K/uL Final  . Sodium 05/14/2013 141  135 - 145 mEq/L Final  . Potassium 05/14/2013 4.0  3.5 - 5.1 mEq/L Final  . Chloride 05/14/2013 103  96 - 112 mEq/L Final  . CO2 05/14/2013 29  19 - 32 mEq/L Final  . Glucose, Bld 05/14/2013 82  70 - 99 mg/dL Final  . BUN 08/65/7846  15  6 - 23 mg/dL Final  . Creatinine, Ser 05/14/2013 0.73  0.50 - 1.10 mg/dL Final  . Calcium 16/03/9603 9.6  8.4 - 10.5 mg/dL Final  . Total Protein 05/14/2013 7.6  6.0 - 8.3 g/dL Final  . Albumin 54/02/8118 3.9  3.5 - 5.2 g/dL Final  . AST 14/78/2956 20  0 - 37 U/L Final  . ALT 05/14/2013 20  0 - 35 U/L Final  . Alkaline Phosphatase 05/14/2013 84  39 - 117 U/L Final  . Total Bilirubin 05/14/2013 0.2* 0.3 - 1.2 mg/dL Final  . GFR calc non Af Amer 05/14/2013 >90  >90 mL/min Final  . GFR calc Af Amer 05/14/2013 >90  >90 mL/min Final   Comment: (NOTE)                          The eGFR has been calculated using the CKD EPI equation.                          This calculation has not been validated in all clinical situations.                          eGFR's persistently <90 mL/min signify possible Chronic Kidney                          Disease.    PATHOLOGY: No new pathology.  Urinalysis No results found for this basename:  colorurine,  appearanceur,  labspec,  phurine,  glucoseu,  hgbur,  bilirubinur,  ketonesur,  proteinur,  urobilinogen,  nitrite,  leukocytesur    RADIOGRAPHIC STUDIES: Mammogram scheduled for June 22, 2013.  ASSESSMENT:  #1.Right sided infiltrating ductal carcinoma breast, triple negative, clinically 5 x 7 cm at presentation. By MRI it was 4 cm, possibly larger but with both CT scans and MRI scans she had abnormal appearing lymph nodes in the axilla and a lesion along the chest wall, very worrisome for disease. The lymph node biopsy was a very small biopsy that was negative of this lesion. She was treated dose dense chemotherapy with Adriamycin and Cytoxan followed by dose dense Taxol both for 4 cycles. She then went on to have radiation therapy after her mastectomy and lymph node dissection which actually showed no residual disease in the breast or in the lymph nodes. Date of surgery was 10/16/2011 #2. Possible hypothyroidism with strong family history. #3. Allergic rhinitis, on treatment with Antihistamines and nasal steroids. #4. Depression, on lower dose Celexa.   PLAN:  #1. CBC, chem profile, CEA, CA 27-29, and TSH today. #2. Left mammogram in 06/22/13. #3. In the presence of her husband, the patient was reassured. She'll be contacted TSH is abnormal so that appropriate intervention can be implemented. #4. Followup in 6 months with lab tests and physical exam. Patient was told to call should any new problems occur that are troublesome and persistent.    All questions were answered. The patient knows to call the clinic with any problems, questions or concerns. We can certainly see the patient much sooner if necessary.   I spent 25 minutes counseling the patient face to face. The total time spent in the appointment was 30 minutes.    Renee Lovely, MD 05/14/2013 11:53 AM

## 2013-05-14 NOTE — Patient Instructions (Signed)
[facility] Discharge Instructions  RECOMMENDATIONS MADE BY THE CONSULTANT AND ANY TEST RESULTS WILL BE SENT TO YOUR REFERRING PHYSICIAN.  Lab work today. We will call you if there are any abnormal results. Mammogram will be scheduled January 2015. Return to clinic in 6 months for MD appointment. Report any issues/concerns to clinic as needed prior to appointment.  Thank you for choosing [facility] to provide your oncology and hematology care.  To afford each patient quality time with our providers, please arrive at least 15 minutes before your scheduled appointment time.  With your help, our goal is to use those 15 minutes to complete the necessary work-up to ensure our physicians have the information they need to help with your evaluation and healthcare recommendations.    Effective January 1st, 2014, we ask that you re-schedule your appointment with our physicians should you arrive 10 or more minutes late for your appointment.  We strive to give you quality time with our providers, and arriving late affects you and other patients whose appointments are after yours.    Again, thank you for choosing [facility].  Our hope is that these requests will decrease the amount of time that you wait before being seen by our physicians.       _____________________________________________________________  Should you have questions after your visit to [facility], please contact our office at [phone] between the hours of 8:30 a.m. and 5:00 p.m.  Voicemails left after 4:30 p.m. will not be returned until the following business day.  For prescription refill requests, have your pharmacy contact our office with your prescription refill request.

## 2013-05-15 LAB — CANCER ANTIGEN 27.29: CA 27.29: 23 U/mL (ref 0–39)

## 2013-05-24 ENCOUNTER — Other Ambulatory Visit: Payer: Self-pay | Admitting: Dermatology

## 2013-06-18 ENCOUNTER — Ambulatory Visit (HOSPITAL_COMMUNITY)
Admission: RE | Admit: 2013-06-18 | Discharge: 2013-06-18 | Disposition: A | Discharge status: Home / Self Care | Payer: PRIVATE HEALTH INSURANCE | Source: Ambulatory Visit | Attending: Oncology | Admitting: Oncology

## 2013-06-18 DIAGNOSIS — Z1231 Encounter for screening mammogram for malignant neoplasm of breast: Secondary | ICD-10-CM | POA: Insufficient documentation

## 2013-06-18 DIAGNOSIS — C50411 Malignant neoplasm of upper-outer quadrant of right female breast: Secondary | ICD-10-CM

## 2013-06-30 ENCOUNTER — Encounter (INDEPENDENT_AMBULATORY_CARE_PROVIDER_SITE_OTHER): Payer: Self-pay | Admitting: General Surgery

## 2013-10-28 ENCOUNTER — Encounter: Payer: Self-pay | Admitting: Cardiology

## 2013-10-28 ENCOUNTER — Ambulatory Visit (HOSPITAL_COMMUNITY): Payer: PRIVATE HEALTH INSURANCE

## 2013-11-02 ENCOUNTER — Encounter (HOSPITAL_COMMUNITY): Payer: PRIVATE HEALTH INSURANCE

## 2013-11-02 NOTE — Telephone Encounter (Signed)
I spoke with Renee Serrano this morning and she explained her issues with the appointments that were scheduled for her with our office.  She stated that she has never seen any of our providers or had any testing done here.  I told her I would research this problem and call her back.   I pulled all of the provider schedules for 10/25/13 and compared to the tests that were scheduled from those appointments.  It appears [name]'s MRN was used in error and the appointments for testing were cancelled within 1-2 minutes of being scheduled.  No paperwork exchanged hands and [name]'s information was not compromised in any way.  She was relieved and I apologized again for her distress.l

## 2013-11-08 ENCOUNTER — Ambulatory Visit: Payer: PRIVATE HEALTH INSURANCE | Admitting: Cardiology

## 2013-11-12 ENCOUNTER — Ambulatory Visit (HOSPITAL_COMMUNITY): Payer: PRIVATE HEALTH INSURANCE

## 2013-11-13 NOTE — Progress Notes (Signed)
This encounter was created in error - please disregard.  This encounter was created in error - please disregard.

## 2014-04-11 ENCOUNTER — Encounter (HOSPITAL_COMMUNITY): Payer: Self-pay

## 2014-11-22 ENCOUNTER — Other Ambulatory Visit: Payer: Self-pay | Admitting: Dermatology

## 2015-11-24 ENCOUNTER — Encounter: Payer: Self-pay | Admitting: Genetic Counselor

## 2016-05-25 ENCOUNTER — Other Ambulatory Visit: Payer: Self-pay | Admitting: Nurse Practitioner

## 2019-09-29 ENCOUNTER — Institutional Professional Consult (permissible substitution): Payer: PRIVATE HEALTH INSURANCE | Admitting: Plastic Surgery

## 2020-01-19 ENCOUNTER — Encounter: Payer: Self-pay | Admitting: Genetic Counselor

## 2021-11-15 ENCOUNTER — Ambulatory Visit (INDEPENDENT_AMBULATORY_CARE_PROVIDER_SITE_OTHER): Payer: Medicare Other

## 2021-11-15 ENCOUNTER — Ambulatory Visit (INDEPENDENT_AMBULATORY_CARE_PROVIDER_SITE_OTHER): Payer: Medicare Other | Admitting: Podiatry

## 2021-11-15 DIAGNOSIS — L84 Corns and callosities: Secondary | ICD-10-CM

## 2021-11-15 DIAGNOSIS — M21611 Bunion of right foot: Secondary | ICD-10-CM

## 2021-11-15 DIAGNOSIS — M21612 Bunion of left foot: Secondary | ICD-10-CM | POA: Diagnosis not present

## 2021-11-15 DIAGNOSIS — M2042 Other hammer toe(s) (acquired), left foot: Secondary | ICD-10-CM

## 2021-11-15 DIAGNOSIS — R2681 Unsteadiness on feet: Secondary | ICD-10-CM

## 2021-11-15 DIAGNOSIS — M2021 Hallux rigidus, right foot: Secondary | ICD-10-CM

## 2021-11-15 DIAGNOSIS — M2022 Hallux rigidus, left foot: Secondary | ICD-10-CM

## 2021-11-18 NOTE — Progress Notes (Signed)
Subjective:   Patient ID: Renee Serrano, female   DOB: 66 y.o.   MRN: [id number]   HPI 66 year old female presents today with her husband for concerns of foot pain.  She states that she gets pain on the left second toe and she has a callus on the bottom of her foot and she also has bunions.  She does get numbness in the ball of her left foot.  She gets some burning, discomfort in the arch at the end of the day.  Her husband is concerned that she does not pick up her feet much with walking and her gait has been off.  She states that she does not want to have surgery.  She had no recent treatment otherwise.  No other concerns.  Review of Systems  All other systems reviewed and are negative.  Past Medical History:  Diagnosis Date   Allergy    Anxiety    Asthma    Breast cancer ([place]) 82/5053   right   Complication of anesthesia    paniced first surg-had to sedate prior to surg   DVT (deep venous thrombosis) ([place]) 2000   does not know why-takes asa; LLL   Hyperlipidemia    Hypertension    Migraines    "hx; taken care of w/sinus OR"   Restless leg syndrome    Sinus drainage    Sleep apnea    does use cpap    Past Surgical History:  Procedure Laterality Date   BREAST BIOPSY  05/07/2011   right- infiltrating ductal ca, ER/PR -, HER 2 -   COLONOSCOPY     FUNCTIONAL ENDOSCOPIC SINUS SURGERY  ~ [place] W/ AXILLARY LYMPH NODES W/ OR W/O PECTORALIS MINOR  10/16/11   right, no residual ca, 10/10 nodes neg   PORT-A-CATH REMOVAL  04/10/2012   Procedure: REMOVAL PORT-A-CATH;  Surgeon: Madilyn Hook, DO;  Location: [facility];  Service: General;  Laterality: N/A;   PORTACATH PLACEMENT  06/17/2011   Procedure: INSERTION PORT-A-CATH;  Surgeon: Judieth Keens, DO;  Location: [facility];  Service: General;  Laterality: Left;  left side mediport placement   VAGINAL HYSTERECTOMY  2004   has ovaries     Current Outpatient Medications:    Ascorbic Acid  (VITAMIN C) 1000 MG tablet, Take 1,000 mg by mouth daily., Disp: , Rfl:    aspirin 81 MG chewable tablet, Chew 81 mg by mouth daily., Disp: , Rfl:    Calcium Carbonate-Vitamin D (CALCIUM + D PO), Take 1 tablet by mouth daily., Disp: , Rfl:    cetirizine (ZYRTEC) 10 MG tablet, Take 10 mg by mouth daily., Disp: , Rfl:    citalopram (CELEXA) 20 MG tablet, Take 20 mg by mouth daily., Disp: , Rfl:    docusate sodium (COLACE) 100 MG capsule, Take 100 mg by mouth daily., Disp: , Rfl:    mometasone (NASONEX) 50 MCG/ACT nasal spray, Place 2 sprays into the nose daily., Disp: , Rfl:    montelukast (SINGULAIR) 10 MG tablet, Take 10 mg by mouth at bedtime. , Disp: , Rfl:    Multiple Vitamin (MULTIVITAMIN) tablet, Take 1 tablet by mouth daily. , Disp: , Rfl:    Naproxen Sodium (ALEVE PO), Take 2 tablets by mouth 2 (two) times daily. Taking bid for 6 days, Disp: , Rfl:    PROAIR HFA 108 (90 BASE) MCG/ACT inhaler, Inhale 1 puff into the lungs as needed. , Disp: , Rfl:  rOPINIRole (REQUIP) 0.5 MG tablet, Take 1 mg by mouth at bedtime. , Disp: , Rfl:    simvastatin (ZOCOR) 40 MG tablet, Take 40 mg by mouth at bedtime. , Disp: , Rfl:   Allergies  Allergen Reactions   Penicillins Hives   Hydrocodone Itching    Noted 3/13           Objective:  Physical Exam  General: AAO x3, NAD  Dermatological: Hyperkeratotic lesion submetatarsal 4 left foot.  Also corn is present on the medial aspect of the left second toe from the first and second toes rub.  There is no underlying ulceration drainage or signs of infection.  Vascular: Dorsalis Pedis artery and Posterior Tibial artery pedal pulses are 2/4 bilateral with immedate capillary fill time. There is no pain with calf compression, swelling, warmth, erythema.   Neruologic: Grossly intact via light touch bilateral.   Musculoskeletal: Decreased range of motion of first MPJ bilaterally and bunions are present.  Tenderness the hyperkeratotic lesion.  Prominent  metatarsal heads.  Hammertoes present.  Muscular strength 5/5 in all groups tested bilateral.  Gait: Unassisted, Nonantalgic.       Assessment:   Bunion/hallux rigidus, hyperkeratotic lesions, gait instability     Plan:  -Treatment options discussed including all alternatives, risks, and complications -Etiology of symptoms were discussed -X-rays were obtained and reviewed with the patient.  3 views of bilateral feet were obtained.  Arthritic changes present the first MPJ.  There is no evidence of acute fracture.  Bunions present. Hervey Ard debridement of hyperkeratotic lesions as a courtesy without any complications or bleeding.  Recommend offloading.  Discussed shoe modifications good arch support.  Dispensed offloading pads. -Given gait instability will refer to physical therapy.

## 2024-04-27 ENCOUNTER — Encounter: Payer: Self-pay | Admitting: Neurology

## 2024-06-08 NOTE — Progress Notes (Signed)
 "   Assessment/Plan:  Assessment and Plan Assessment & Plan Tremor predominant Parkinson's disease Diagnosed with tremor predominant Parkinson's disease, characterized by tremors in the left arm and leg, and changes in handwriting. Symptoms include difficulty walking, balance issues, and a slow, dragging gait. The condition is progressive but may progress slower than other types of Parkinson's disease.Exercise is emphasized as a critical component in managing the disease and slowing its progression. - Adjusted carbidopa-levodopa dosing to 7 AM, 11 AM, 3 PM, and 7 PM to improve symptom control and reduce inner tremor.  Discussed interaction with protein. - Encouraged regular exercise, including 30 minutes of moderate cardiovascular activity five days a week, to slow disease progression. - Consider additional exercises such as yoga, Tai Chi, and physical therapy to improve balance and strength.  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 Drooling affects sleep mask use and is a significant issue for her. - Will explore Botox treatment options for sialorrhea, considering insurance coverage and availability of preferred Botox brand (Myobloc).  Will likely have to start with Xeomin as myobloc has been very difficult to obtain.  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 Currently seeing a psychologist for counseling, which is beneficial for mental health management. Discussed the potential need for antidepressants, as 75% of Parkinson's patients may require them at some point. - Continue counseling with a psychologist. - Consider psychiatric evaluation and potential antidepressant therapy if symptoms persist or worsen.   Sleep apnea -wears cpap  Lumbar radiculopathy -follows with Dr. Gillie and drags R leg because of that.  Told she  was a surgical candidate but is afraid of that.  Discussed follow-up with Dr. Gillie.    Subjective:  Discussed the use of AI scribe software for clinical note transcription with the patient, who gave verbal consent to proceed.  History of Present Illness     Renee Serrano was seen today in the movement disorders clinic for neurologic consultation at the request of Irven Ozell DEL, MD.  The consultation is for the evaluation of second opinion regarding Parkinson's.  Pt with husband who supplements history.  Patient previously has seen Dr. Liborio and Dr. Hope.  I do not have Dr. Dicie notes, but I do have Dr. Cottie notes.  Patient for started seeing Dr. Hope November, 2024 with complaints of tremor in the left leg for several years prior to seeing him and subsequently started in the left arm about 1 year prior to seeing him.  She was apparently started on ropinirole  by her sleep doc for restless leg but that was dx by sleep study (sounds like plmd) as she was kicking/jerking at night.  She was started on levodopa with Dr. Hope for presumed Parkinson's.  She also had an MRI, EEG and EMG by Dr. Liborio, which were apparently unremarkable with the exception of demonstration of mild neuropathy.  Again, I do not have Dr. Dicie records.  On the first visit with Dr. Hope in November, 2024, he felt that the patient's examination was suggestive of Parkinsons disease.  He told her to continue levodopa, 1 tablet 3 times per day.  She was last seen by Dr. Hope in November, 2025 at which point she was on carbidopa/levodopa 25/100, 1 tablet 4 times per day and is taking it at 7:30am/12:30pm/5:30pm/10:30pm  Pt notes that her first sx's were balance change in 2022 and first saw  Dr. Liborio late 2022/early 2023.  He didn't think she had Parkinsons Disease.  She then transferred care to Dr. Hope, as above.     Specific Symptoms:  Tremor: L arm/left leg (just sitting relaxed and when nervous and  at church); notes at church as well.  She is R handed Family hx of similar:  No. Voice: weaker Sleep:   Vivid Dreams:  Yes.   (Better with d/c of singulair )  Acting out dreams:  Yes.   Night drooling: Yes.   Postural symptoms:  Yes.    Falls?  Yes.  , 2 times since November; one time she had a walker and a glass in her hand and was trying to lift the walker over a step up into the den; 2nd fal - putting clothes away into the dresser (so walker next to her but not holding onto it) and was in a tight space and went to turn and fell   Bradykinesia symptoms: difficulty getting out of a chair; drags R leg but states that is from her back Loss of smell:  Yes.   Loss of taste:  Yes.   Urinary Incontinence:  wears pad from q hs urgency Difficulty Swallowing:  only with saliva at night Handwriting, micrographia: Yes.   Trouble with ADL's:  No.  Trouble buttoning clothing: No. Depression:  Yes.   And anxiety - sees psychology for counseling Memory changes:  husband thinks its good and pt thinks not as good as it used to be Hallucinations:  No.  visual distortions: No. N/V:  No. Lightheaded:  No.  Syncope: No. Diplopia:  occasional and goes away if closes eye Dyskinesia:  No. Prior exposure to reglan/antipsychotics: No.   PREVIOUS MEDICATIONS: Sinemet and Requip   ALLERGIES:   Allergies  Allergen Reactions   Amoxicillin-Pot Clavulanate Dermatitis and Hives   Cephalosporins Hives and Rash   Codeine Itching and Rash   Penicillins Hives   Hydrocodone  Itching    Noted 3/13   Cefuroxime Hives   Cat Dander Rash   Hydrocodone -Acetaminophen  Itching    CURRENT MEDICATIONS:  Current Meds  Medication Sig   albuterol (PROAIR HFA) 108 (90 Base) MCG/ACT inhaler Inhale 2 puffs into the lungs every 6 (six) hours as needed for wheezing or shortness of breath.   Ascorbic Acid (VITAMIN C) 1000 MG tablet Take 1,000 mg by mouth daily.   Calcium Carbonate-Vitamin D (CALCIUM 600+D PO)     carbidopa-levodopa (SINEMET IR) 25-100 MG tablet Take 1 tablet by mouth 3 (three) times daily. (Patient taking differently: Take 1 tablet by mouth 4 (four) times daily.)   cetirizine (ZYRTEC) 10 MG tablet Take 10 mg by mouth daily.   docusate sodium  (COLACE) 100 MG capsule Take 100 mg by mouth daily.   estradiol (ESTRACE) 0.01 % CREA vaginal cream Place 1 Applicatorful vaginally once a week.   hydrochlorothiazide (HYDRODIURIL) 25 MG tablet Take 25 mg by mouth daily.   Lactobacillus (PROBIOTIC ACIDOPHILUS PO)    magnesium hydroxide (MILK OF MAGNESIA) 400 MG/5ML suspension Take by mouth at bedtime as needed.   meloxicam (MOBIC) 15 MG tablet Take 15 mg by mouth daily.   Multiple Vitamin (MULTIVITAMIN) tablet Take 1 tablet by mouth daily.    Multiple Vitamins-Minerals (PRESERVISION AREDS 2 PO)    Naproxen Sodium (ALEVE PO) Take 2 tablets by mouth 2 (two) times daily. Taking bid for 6 days   PROAIR HFA 108 (90 BASE) MCG/ACT inhaler Inhale 1 puff into the lungs as needed.    rOPINIRole  (  REQUIP ) 0.5 MG tablet Take 1 mg by mouth at bedtime.    rosuvastatin (CRESTOR) 20 MG tablet Take 20 mg by mouth daily.     Objective:   VITALS:   Vitals:   06/17/24 0935  BP: 136/80  Pulse: 83  SpO2: 98%  Weight: 202 lb 6.4 oz (91.8 kg)  Height: 5' 6 (1.676 m)    GEN:  The patient appears stated age and is in NAD. HEENT:  Normocephalic, atraumatic.  The mucous membranes are moist. The superficial temporal arteries are without ropiness or tenderness. CV:  RRR Lungs:  CTAB Neck/HEME:  There are no carotid bruits bilaterally.  Neurological examination:  Orientation: The patient is alert and oriented x3.  Cranial nerves: There is good facial symmetry. There is facial hypomimia.  Extraocular muscles are intact. The visual fields are full to confrontational testing. The speech is fluent and clear but she is hypophonic.   Soft palate rises symmetrically and there is no tongue deviation. Hearing is intact to  conversational tone. Sensation: Sensation is intact to light and pinprick throughout (facial, trunk, extremities). Vibration is intact at the bilateral big toe. There is no extinction with double simultaneous stimulation. There is no sensory dermatomal level identified. Motor: Strength is 5/5 in the bilateral upper and lower extremities.   Shoulder shrug is equal and symmetric.  There is no pronator drift. Deep tendon reflexes: Deep tendon reflexes are 3/4 at the bilateral biceps, triceps, brachioradialis, 2/4 at the bilateral patella and achilles. Plantar responses are downgoing bilaterally.  Movement examination: Tone: There is mild increased tone in the LUE.  Tone elsewhere is normal Abnormal movements: there is LLE tremor in the L foot and rarely in the L arm.  Doesn't increase with distration Coordination:  There is min decremation with RAM's, only with toe taps on the L Gait and Station: The patient pushes off to arise.  The patient's stride length is decreased and she drags both legs but gait is antalgic and there is sacral base unleveling as she ambulates and appears to be weakness of the hip flexors.   I have reviewed and interpreted the following labs independently   Chemistry      Component Value Date/Time   NA 141 05/14/2013 0923   K 4.0 05/14/2013 0923   CL 103 05/14/2013 0923   CO2 29 05/14/2013 0923   BUN 15 05/14/2013 0923   CREATININE 0.73 05/14/2013 0923      Component Value Date/Time   CALCIUM 9.6 05/14/2013 0923   ALKPHOS 84 05/14/2013 0923   AST 20 05/14/2013 0923   ALT 20 05/14/2013 0923   BILITOT 0.2 (L) 05/14/2013 0923      Lab Results  Component Value Date   TSH 2.686 05/14/2013   Lab Results  Component Value Date   WBC 10.0 05/14/2013   HGB 13.4 05/14/2013   HCT 40.9 05/14/2013   MCV 94.0 05/14/2013   PLT 278 05/14/2013     Total time spent on today's visit was 70 minutes, including both face-to-face time and nonface-to-face time.  Time included  that spent on review of records (prior notes available to me/labs/imaging if pertinent), discussing treatment and goals, answering patient's questions and coordinating care.  Cc:  Irven Ozell DEL, MD   "

## 2024-06-11 ENCOUNTER — Other Ambulatory Visit: Payer: Self-pay

## 2024-06-17 ENCOUNTER — Ambulatory Visit (INDEPENDENT_AMBULATORY_CARE_PROVIDER_SITE_OTHER): Payer: PRIVATE HEALTH INSURANCE | Admitting: Neurology

## 2024-06-17 ENCOUNTER — Encounter: Payer: Self-pay | Admitting: Neurology

## 2024-06-17 ENCOUNTER — Telehealth: Payer: Self-pay

## 2024-06-17 VITALS — BP 136/80 | HR 83 | Ht 66.0 in | Wt 202.4 lb

## 2024-06-17 DIAGNOSIS — G20A1 Parkinson's disease without dyskinesia, without mention of fluctuations: Secondary | ICD-10-CM | POA: Diagnosis not present

## 2024-06-17 DIAGNOSIS — G4733 Obstructive sleep apnea (adult) (pediatric): Secondary | ICD-10-CM | POA: Diagnosis not present

## 2024-06-17 DIAGNOSIS — F330 Major depressive disorder, recurrent, mild: Secondary | ICD-10-CM

## 2024-06-17 DIAGNOSIS — K117 Disturbances of salivary secretion: Secondary | ICD-10-CM

## 2024-06-17 DIAGNOSIS — M5416 Radiculopathy, lumbar region: Secondary | ICD-10-CM

## 2024-06-17 NOTE — Telephone Encounter (Signed)
 I need a Pa for Xeomin 100 units Sialorrhea [id number] Thank you

## 2024-06-17 NOTE — Patient Instructions (Signed)
" °  VISIT SUMMARY: Today, you had a neurology consultation to discuss your Parkinson's disease. We reviewed your symptoms, including tremors, balance issues, and changes in handwriting. We also discussed your experiences with drooling, mood changes, and other related symptoms.  YOUR PLAN: -TREMOR PREDOMINANT PARKINSON'S DISEASE: You have been diagnosed with tremor predominant Parkinson's disease, which is characterized by tremors, difficulty walking, and balance issues. This condition progresses over time but may do so more slowly than other types of Parkinson's disease. We have adjusted your carbidopa-levodopa dosing to 7 AM, 11 AM, 3 PM, and 7 PM to help control your symptoms better. Regular exercise, such as 30 minutes of moderate cardiovascular activity five days a week, is crucial to managing your disease and slowing its progression. Additional exercises like yoga, Tai Chi, and physical therapy can help improve your balance and strength. We will also discuss potential surgical options for your pinched nerve if it becomes unmanageable.  -SIALORRHEA DUE TO PARKINSON'S DISEASE: You are experiencing drooling, which is common in Parkinson's disease due to slowed swallowing. This affects your use of the sleep mask. We will explore Botox treatment options to help manage this symptom, considering insurance coverage and availability of the preferred Botox brand (Myobloc).  -DEPRESSION AND ANXIETY IN PARKINSON'S DISEASE: Feeling anxious and depressed is common in Parkinson's disease. You are currently seeing a psychologist for counseling, which is beneficial. We discussed the potential need for antidepressants, as many Parkinson's patients may require them at some point. Continue with your counseling sessions, and we will consider a psychiatric evaluation and potential antidepressant therapy if your symptoms persist or worsen.  INSTRUCTIONS: Please follow the adjusted carbidopa-levodopa dosing schedule: 7 AM, 11  AM, 3 PM, and 7 PM. Engage in regular exercise, aiming for 30 minutes of moderate cardiovascular activity five days a week. Consider additional exercises like yoga, Tai Chi, and physical therapy to improve balance and strength. Continue your counseling sessions with your psychologist. We will explore Botox treatment options for your drooling and discuss potential surgical options for your pinched nerve if needed. If your mood symptoms persist or worsen, we may consider a psychiatric evaluation and potential antidepressant therapy.  You can let us  or your primary doctor know if you would like a referral to psychiatry                      Contains text generated by [employer]bridge.                                 Contains text generated by [employer]bridge.   "

## 2024-06-18 ENCOUNTER — Other Ambulatory Visit (HOSPITAL_COMMUNITY): Payer: Self-pay

## 2024-06-18 ENCOUNTER — Telehealth: Payer: Self-pay | Admitting: Pharmacy Technician

## 2024-06-18 NOTE — Telephone Encounter (Signed)
 PA has been submitted, and telephone encounter has been created. Please see telephone encounter dated 1.9.26.

## 2024-06-18 NOTE — Telephone Encounter (Signed)
 Pharmacy Patient Advocate Encounter   Received notification from Pt Calls Messages that prior authorization for XEOMIN 100 is required/requested.   Insurance verification completed.   The patient is insured through [facility].   Per test claim: PA required; PA submitted to above mentioned insurance via Latent Key/confirmation #/EOC [id number] Status is pending

## 2024-12-15 ENCOUNTER — Ambulatory Visit: Payer: Self-pay | Admitting: Neurology
